# Patient Record
Sex: Male | Born: 1975 | Marital: Married | Smoking: Never smoker
Health system: Southern US, Community
[De-identification: ages and names within clinical notes are randomized; demographics above are authoritative.]

## PROBLEM LIST (undated history)

## (undated) HISTORY — PX: HERNIA REPAIR: SHX51

## (undated) HISTORY — PX: COLONOSCOPY: SHX174

---

## 2020-02-10 ENCOUNTER — Other Ambulatory Visit: Payer: Self-pay

## 2020-02-10 ENCOUNTER — Encounter: Payer: Self-pay | Admitting: Registered Nurse

## 2020-02-10 ENCOUNTER — Ambulatory Visit (INDEPENDENT_AMBULATORY_CARE_PROVIDER_SITE_OTHER): Payer: No Typology Code available for payment source | Admitting: Registered Nurse

## 2020-02-10 VITALS — BP 111/74 | HR 76 | Temp 97.9°F | Resp 17 | Ht 65.0 in | Wt 134.4 lb

## 2020-02-10 DIAGNOSIS — Z1329 Encounter for screening for other suspected endocrine disorder: Secondary | ICD-10-CM | POA: Diagnosis not present

## 2020-02-10 DIAGNOSIS — Z13220 Encounter for screening for lipoid disorders: Secondary | ICD-10-CM

## 2020-02-10 DIAGNOSIS — Z1159 Encounter for screening for other viral diseases: Secondary | ICD-10-CM

## 2020-02-10 DIAGNOSIS — Z0000 Encounter for general adult medical examination without abnormal findings: Secondary | ICD-10-CM | POA: Diagnosis not present

## 2020-02-10 DIAGNOSIS — Z130 Encounter for screening for diseases of the blood and blood-forming organs and certain disorders involving the immune mechanism: Secondary | ICD-10-CM

## 2020-02-10 DIAGNOSIS — Z23 Encounter for immunization: Secondary | ICD-10-CM

## 2020-02-10 DIAGNOSIS — Z7689 Persons encountering health services in other specified circumstances: Secondary | ICD-10-CM

## 2020-02-10 DIAGNOSIS — Z13228 Encounter for screening for other metabolic disorders: Secondary | ICD-10-CM

## 2020-02-10 NOTE — Progress Notes (Signed)
New

## 2020-02-10 NOTE — Patient Instructions (Signed)
° ° ° °  If you have lab work done today you will be contacted with your lab results within the next 2 weeks.  If you have not heard from us then please contact us. The fastest way to get your results is to register for My Chart. ° ° °IF you received an x-ray today, you will receive an invoice from [facility]. Please contact [facility] at [phone] with questions or concerns regarding your invoice.  ° °IF you received labwork today, you will receive an invoice from [facility]. Please contact [facility] at [phone] with questions or concerns regarding your invoice.  ° °Our billing staff will not be able to assist you with questions regarding bills from these companies. ° °You will be contacted with the lab results as soon as they are available. The fastest way to get your results is to activate your My Chart account. Instructions are located on the last page of this paperwork. If you have not heard from us regarding the results in 2 weeks, please contact this office. °  ° ° ° °

## 2020-02-11 LAB — COMPREHENSIVE METABOLIC PANEL
ALT: 21 IU/L (ref 0–44)
AST: 25 IU/L (ref 0–40)
Albumin/Globulin Ratio: 2 (ref 1.2–2.2)
Albumin: 4.5 g/dL (ref 4.0–5.0)
Alkaline Phosphatase: 72 IU/L (ref 39–117)
BUN/Creatinine Ratio: 12 (ref 9–20)
BUN: 8 mg/dL (ref 6–24)
Bilirubin Total: 0.5 mg/dL (ref 0.0–1.2)
CO2: 25 mmol/L (ref 20–29)
Calcium: 9.5 mg/dL (ref 8.7–10.2)
Chloride: 102 mmol/L (ref 96–106)
Creatinine, Ser: 0.68 mg/dL — ABNORMAL LOW (ref 0.76–1.27)
GFR calc Af Amer: 133 mL/min/{1.73_m2} (ref >59)
GFR calc non Af Amer: 115 mL/min/{1.73_m2} (ref >59)
Globulin, Total: 2.2 g/dL (ref 1.5–4.5)
Glucose: 100 mg/dL — ABNORMAL HIGH (ref 65–99)
Potassium: 4.3 mmol/L (ref 3.5–5.2)
Sodium: 140 mmol/L (ref 134–144)
Total Protein: 6.7 g/dL (ref 6.0–8.5)

## 2020-02-11 LAB — LIPID PANEL
Chol/HDL Ratio: 7.4 ratio — ABNORMAL HIGH (ref 0.0–5.0)
Cholesterol, Total: 216 mg/dL — ABNORMAL HIGH (ref 100–199)
HDL: 29 mg/dL — ABNORMAL LOW (ref >39)
LDL Chol Calc (NIH): 113 mg/dL — ABNORMAL HIGH (ref 0–99)
Triglycerides: 428 mg/dL — ABNORMAL HIGH (ref 0–149)
VLDL Cholesterol Cal: 74 mg/dL — ABNORMAL HIGH (ref 5–40)

## 2020-02-11 LAB — CBC WITH DIFFERENTIAL/PLATELET
Basophils Absolute: 0.1 10*3/uL (ref 0.0–0.2)
Basos: 1 %
EOS (ABSOLUTE): 0.1 10*3/uL (ref 0.0–0.4)
Eos: 2 %
Hematocrit: 43.3 % (ref 37.5–51.0)
Hemoglobin: 14.7 g/dL (ref 13.0–17.7)
Immature Grans (Abs): 0 10*3/uL (ref 0.0–0.1)
Immature Granulocytes: 1 %
Lymphocytes Absolute: 1.7 10*3/uL (ref 0.7–3.1)
Lymphs: 27 %
MCH: 29.6 pg (ref 26.6–33.0)
MCHC: 33.9 g/dL (ref 31.5–35.7)
MCV: 87 fL (ref 79–97)
Monocytes Absolute: 0.6 10*3/uL (ref 0.1–0.9)
Monocytes: 9 %
Neutrophils Absolute: 3.8 10*3/uL (ref 1.4–7.0)
Neutrophils: 60 %
Platelets: 309 10*3/uL (ref 150–450)
RBC: 4.96 x10E6/uL (ref 4.14–5.80)
RDW: 12.5 % (ref 11.6–15.4)
WBC: 6.3 10*3/uL (ref 3.4–10.8)

## 2020-02-11 LAB — TSH: TSH: 3.71 u[IU]/mL (ref 0.450–4.500)

## 2020-02-11 LAB — HIV ANTIBODY (ROUTINE TESTING W REFLEX): HIV Screen 4th Generation wRfx: NONREACTIVE

## 2020-02-11 LAB — HEMOGLOBIN A1C
Est. average glucose Bld gHb Est-mCnc: 100 mg/dL
Hgb A1c MFr Bld: 5.1 % (ref 4.8–5.6)

## 2020-02-22 ENCOUNTER — Encounter: Payer: Self-pay | Admitting: Registered Nurse

## 2020-02-22 NOTE — Progress Notes (Signed)
New Patient Office Visit  Subjective:  Patient ID: Darryl Jackson, male    DOB: 17-Sep-1976  Age: 44 y.o. MRN: [id number]  CC:  Chief Complaint  Patient presents with  . New Patient (Initial Visit)    establish care and CPE    HPI Darryl Ramkumar Ranganathan presents for visit to establish care and CPE Due for tdap - will give today  No major concerns or complaints  Recently moved to [place] from [place], [place] with his wife and young child. She will be establishing care with our office within the next week or two.  No major medical hx. Has had colonoscopy and hernia repair.   History reviewed. No pertinent past medical history.  Past Surgical History:  Procedure Laterality Date  . COLONOSCOPY    . HERNIA REPAIR      Family History  Problem Relation Age of Onset  . Diabetes Mother   . Healthy Father   . Healthy Sister     Social History   Socioeconomic History  . Marital status: Married    Spouse name: Not on file  . Number of children: Not on file  . Years of education: Not on file  . Highest education level: Not on file  Occupational History  . Not on file  Tobacco Use  . Smoking status: Never Smoker  . Smokeless tobacco: Never Used  Substance and Sexual Activity  . Alcohol use: Never  . Drug use: Never  . Sexual activity: Yes  Other Topics Concern  . Not on file  Social History Narrative  . Not on file   Social Determinants of Health   Financial Resource Strain:   . Difficulty of Paying Living Expenses:   Food Insecurity:   . Worried About Programme researcher, broadcasting/film/video in the Last Year:   . Barista in the Last Year:   Transportation Needs:   . Freight forwarder (Medical):   Marland Kitchen Lack of Transportation (Non-Medical):   Physical Activity:   . Days of Exercise per Week:   . Minutes of Exercise per Session:   Stress:   . Feeling of Stress :   Social Connections:   . Frequency of Communication with Friends and Family:   . Frequency of Social  Gatherings with Friends and Family:   . Attends Religious Services:   . Active Member of Clubs or Organizations:   . Attends Banker Meetings:   Marland Kitchen Marital Status:   Intimate Partner Violence:   . Fear of Current or Ex-Partner:   . Emotionally Abused:   Marland Kitchen Physically Abused:   . Sexually Abused:     ROS Review of Systems  Constitutional: Negative.   HENT: Negative.   Eyes: Negative.   Respiratory: Negative.   Cardiovascular: Negative.   Gastrointestinal: Negative.   Endocrine: Negative.   Genitourinary: Negative.   Musculoskeletal: Negative.   Skin: Negative.   Allergic/Immunologic: Negative.   Neurological: Negative.   Hematological: Negative.   Psychiatric/Behavioral: Negative.   All other systems reviewed and are negative.   Objective:   Today's Vitals: BP 111/74   Pulse 76   Temp 97.9 F (36.6 C) (Temporal)   Resp 17   Ht 5\' 5"  (1.651 m)   Wt 134 lb 6.4 oz (61 kg)   SpO2 99%   BMI 22.37 kg/m   Physical Exam Vitals and nursing note reviewed.  Constitutional:      General: He is not in acute distress.  Appearance: Normal appearance. He is normal weight. He is not ill-appearing, toxic-appearing or diaphoretic.  HENT:     Head: Normocephalic and atraumatic.     Right Ear: Tympanic membrane, ear canal and external ear normal. There is no impacted cerumen.     Left Ear: Tympanic membrane, ear canal and external ear normal. There is no impacted cerumen.     Nose: Nose normal. No congestion or rhinorrhea.     Mouth/Throat:     Mouth: Mucous membranes are moist.     Pharynx: Oropharynx is clear. No oropharyngeal exudate or posterior oropharyngeal erythema.  Eyes:     General: No scleral icterus.       Right eye: No discharge.        Left eye: No discharge.     Extraocular Movements: Extraocular movements intact.     Conjunctiva/sclera: Conjunctivae normal.     Pupils: Pupils are equal, round, and reactive to light.  Cardiovascular:     Rate and  Rhythm: Normal rate and regular rhythm.     Pulses: Normal pulses.     Heart sounds: Normal heart sounds. No murmur. No friction rub. No gallop.   Pulmonary:     Effort: Pulmonary effort is normal. No respiratory distress.     Breath sounds: Normal breath sounds. No stridor. No wheezing, rhonchi or rales.  Chest:     Chest wall: No tenderness.  Abdominal:     General: Abdomen is flat. Bowel sounds are normal. There is no distension.     Palpations: Abdomen is soft. There is no mass.     Tenderness: There is no abdominal tenderness. There is no right CVA tenderness, left CVA tenderness, guarding or rebound.     Hernia: No hernia is present.  Musculoskeletal:        General: No swelling, tenderness, deformity or signs of injury. Normal range of motion.     Right lower leg: No edema.     Left lower leg: No edema.  Skin:    General: Skin is warm and dry.     Capillary Refill: Capillary refill takes less than 2 seconds.     Coloration: Skin is not jaundiced or pale.     Findings: No bruising, erythema, lesion or rash.  Neurological:     General: No focal deficit present.     Mental Status: He is alert and oriented to person, place, and time. Mental status is at baseline.     Cranial Nerves: No cranial nerve deficit.     Sensory: No sensory deficit.     Motor: No weakness.     Coordination: Coordination normal.     Gait: Gait normal.     Deep Tendon Reflexes: Reflexes normal.  Psychiatric:        Mood and Affect: Mood normal.        Behavior: Behavior normal.        Thought Content: Thought content normal.        Judgment: Judgment normal.     Assessment & Plan:   Problem List Items Addressed This Visit    None    Visit Diagnoses    Screening for endocrine, metabolic and immunity disorder    -  Primary   Relevant Orders   CBC with Differential (Completed)   Hemoglobin A1c (Completed)   Comprehensive metabolic panel (Completed)   TSH (Completed)   Need for  diphtheria-tetanus-pertussis (Tdap) vaccine       Relevant Orders   Tdap vaccine greater  than or equal to 7yo IM (Completed)   Lipid screening       Relevant Orders   Lipid panel (Completed)   Screening for viral disease       Relevant Orders   HIV antibody (with reflex) (Completed)   Encounter to establish care       Annual physical exam          No outpatient encounter medications on file as of 02/10/2020.   No facility-administered encounter medications on file as of 02/10/2020.    Follow-up: No follow-ups on file.   PLAN  Labs collected, will follow up as warranted  Exam unremarkable  Otherwise no concerns  Patient encouraged to call clinic with any questions, comments, or concerns.  Janeece Agee, NP

## 2020-02-24 ENCOUNTER — Encounter: Payer: Self-pay | Admitting: Registered Nurse

## 2020-02-24 DIAGNOSIS — E782 Mixed hyperlipidemia: Secondary | ICD-10-CM

## 2020-02-24 MED ORDER — ATORVASTATIN CALCIUM 40 MG PO TABS: 40.0000 mg | ORAL_TABLET | Freq: Every day | ORAL | 3 refills | Status: DC

## 2020-02-24 NOTE — Progress Notes (Signed)
Elevated lipids Called patient Start atorvastatin 40mg  PO qd with dinner Return in 6 mos  , NP

## 2020-02-28 ENCOUNTER — Encounter: Payer: Self-pay | Admitting: Registered Nurse

## 2020-04-08 ENCOUNTER — Telehealth: Payer: Self-pay | Admitting: Registered Nurse

## 2020-04-08 NOTE — Telephone Encounter (Signed)
Pt called and stated he missed a call from our office and was returning that call. Please advise.

## 2020-04-08 NOTE — Telephone Encounter (Signed)
Looked through pt chart saw no notes, lab results or upcoming appts. Unsure what the call was about

## 2020-08-05 ENCOUNTER — Encounter: Payer: Self-pay | Admitting: Registered Nurse

## 2020-08-05 ENCOUNTER — Ambulatory Visit (INDEPENDENT_AMBULATORY_CARE_PROVIDER_SITE_OTHER): Payer: No Typology Code available for payment source | Admitting: Registered Nurse

## 2020-08-05 ENCOUNTER — Other Ambulatory Visit: Payer: Self-pay

## 2020-08-05 VITALS — BP 106/72 | HR 69 | Temp 98.4°F | Ht 64.0 in | Wt 133.4 lb

## 2020-08-05 DIAGNOSIS — Z1159 Encounter for screening for other viral diseases: Secondary | ICD-10-CM

## 2020-08-05 DIAGNOSIS — Z1329 Encounter for screening for other suspected endocrine disorder: Secondary | ICD-10-CM | POA: Diagnosis not present

## 2020-08-05 DIAGNOSIS — Z13228 Encounter for screening for other metabolic disorders: Secondary | ICD-10-CM

## 2020-08-05 DIAGNOSIS — E785 Hyperlipidemia, unspecified: Secondary | ICD-10-CM

## 2020-08-05 DIAGNOSIS — Z13220 Encounter for screening for lipoid disorders: Secondary | ICD-10-CM

## 2020-08-05 DIAGNOSIS — Z130 Encounter for screening for diseases of the blood and blood-forming organs and certain disorders involving the immune mechanism: Secondary | ICD-10-CM

## 2020-08-05 DIAGNOSIS — Z23 Encounter for immunization: Secondary | ICD-10-CM

## 2020-08-05 NOTE — Patient Instructions (Signed)
° ° ° °  If you have lab work done today you will be contacted with your lab results within the next 2 weeks.  If you have not heard from us then please contact us. The fastest way to get your results is to register for My Chart. ° ° °IF you received an x-ray today, you will receive an invoice from [facility]. Please contact [facility] at [phone] with questions or concerns regarding your invoice.  ° °IF you received labwork today, you will receive an invoice from [facility]. Please contact [facility] at [phone] with questions or concerns regarding your invoice.  ° °Our billing staff will not be able to assist you with questions regarding bills from these companies. ° °You will be contacted with the lab results as soon as they are available. The fastest way to get your results is to activate your My Chart account. Instructions are located on the last page of this paperwork. If you have not heard from us regarding the results in 2 weeks, please contact this office. °  ° ° ° °

## 2020-08-06 LAB — HEMOGLOBIN A1C
Est. average glucose Bld gHb Est-mCnc: 103 mg/dL
Hgb A1c MFr Bld: 5.2 % (ref 4.8–5.6)

## 2020-08-06 LAB — CMP14+EGFR
ALT: 14 IU/L (ref 0–44)
AST: 19 IU/L (ref 0–40)
Albumin/Globulin Ratio: 1.8 (ref 1.2–2.2)
Albumin: 4.4 g/dL (ref 4.0–5.0)
Alkaline Phosphatase: 67 IU/L (ref 44–121)
BUN/Creatinine Ratio: 14 (ref 9–20)
BUN: 9 mg/dL (ref 6–24)
Bilirubin Total: 0.6 mg/dL (ref 0.0–1.2)
CO2: 26 mmol/L (ref 20–29)
Calcium: 9.7 mg/dL (ref 8.7–10.2)
Chloride: 103 mmol/L (ref 96–106)
Creatinine, Ser: 0.64 mg/dL — ABNORMAL LOW (ref 0.76–1.27)
GFR calc Af Amer: 137 mL/min/{1.73_m2} (ref >59)
GFR calc non Af Amer: 118 mL/min/{1.73_m2} (ref >59)
Globulin, Total: 2.4 g/dL (ref 1.5–4.5)
Glucose: 85 mg/dL (ref 65–99)
Potassium: 4.2 mmol/L (ref 3.5–5.2)
Sodium: 139 mmol/L (ref 134–144)
Total Protein: 6.8 g/dL (ref 6.0–8.5)

## 2020-08-06 LAB — LIPID PANEL
Chol/HDL Ratio: 7.7 ratio — ABNORMAL HIGH (ref 0.0–5.0)
Cholesterol, Total: 215 mg/dL — ABNORMAL HIGH (ref 100–199)
HDL: 28 mg/dL — ABNORMAL LOW (ref >39)
LDL Chol Calc (NIH): 129 mg/dL — ABNORMAL HIGH (ref 0–99)
Triglycerides: 323 mg/dL — ABNORMAL HIGH (ref 0–149)
VLDL Cholesterol Cal: 58 mg/dL — ABNORMAL HIGH (ref 5–40)

## 2020-08-06 LAB — HEPATITIS C ANTIBODY: Hep C Virus Ab: <0.1 s/co ratio (ref 0.0–0.9)

## 2020-08-10 ENCOUNTER — Encounter: Payer: Self-pay | Admitting: Registered Nurse

## 2020-08-16 ENCOUNTER — Other Ambulatory Visit: Payer: Self-pay | Admitting: Registered Nurse

## 2020-08-16 DIAGNOSIS — E782 Mixed hyperlipidemia: Secondary | ICD-10-CM

## 2020-08-16 MED ORDER — ATORVASTATIN CALCIUM 40 MG PO TABS: 40.0000 mg | ORAL_TABLET | Freq: Every day | ORAL | 3 refills | Status: DC

## 2020-08-17 ENCOUNTER — Other Ambulatory Visit: Payer: Self-pay | Admitting: Registered Nurse

## 2020-08-17 DIAGNOSIS — E782 Mixed hyperlipidemia: Secondary | ICD-10-CM

## 2020-08-17 MED ORDER — COLESEVELAM HCL 625 MG PO TABS: 625.0000 mg | ORAL_TABLET | Freq: Two times a day (BID) | ORAL | 3 refills | Status: DC

## 2020-10-09 ENCOUNTER — Encounter: Payer: Self-pay | Admitting: Registered Nurse

## 2020-10-09 NOTE — Progress Notes (Signed)
Established Patient Office Visit  Subjective:  Patient ID: Darryl Jackson, male    DOB: [birth year]  Age: 44 y.o. MRN: [id number]  CC:  Chief Complaint  Patient presents with   Follow-up    x1mo    HPI Darryl Ramkumar Salberg presents for 6 mo follow up  Hyperlipidemia: has been taking medication regularly no AEs. Feeling well overall No other concerns.  Flu vaccine due today - pt requests this  History reviewed. No pertinent past medical history.  Past Surgical History:  Procedure Laterality Date   COLONOSCOPY     HERNIA REPAIR      Family History  Problem Relation Age of Onset   Diabetes Mother    Healthy Father    Healthy Sister     Social History   Socioeconomic History   Marital status: Married    Spouse name: Not on file   Number of children: Not on file   Years of education: Not on file   Highest education level: Not on file  Occupational History   Not on file  Tobacco Use   Smoking status: Never Smoker   Smokeless tobacco: Never Used  Vaping Use   Vaping Use: Never used  Substance and Sexual Activity   Alcohol use: Never   Drug use: Never   Sexual activity: Yes  Other Topics Concern   Not on file  Social History Narrative   Not on file   Social Determinants of Health   Financial Resource Strain:    Difficulty of Paying Living Expenses: Not on file  Food Insecurity:    Worried About REast [place] the Last Year: Not on file   Ran Out of Food in the Last Year: Not on file  Transportation Needs:    Lack of Transportation (Medical): Not on file   Lack of Transportation (Non-Medical): Not on file  Physical Activity:    Days of Exercise per Week: Not on file   Minutes of Exercise per Session: Not on file  Stress:    Feeling of Stress : Not on file  Social Connections:    Frequency of Communication with Friends and Family: Not on file   Frequency of Social Gatherings with Friends and Family:  Not on file   Attends Religious Services: Not on file   Active Member of Clubs or Organizations: Not on file   Attends CArchivistMeetings: Not on file   Marital Status: Not on file  Intimate Partner Violence:    Fear of Current or Ex-Partner: Not on file   Emotionally Abused: Not on file   Physically Abused: Not on file   Sexually Abused: Not on file    Outpatient Medications Prior to Visit  Medication Sig Dispense Refill   atorvastatin (LIPITOR) 40 MG tablet Take 1 tablet (40 mg total) by mouth daily. (Patient not taking: Reported on 08/05/2020) 90 tablet 3   No facility-administered medications prior to visit.    No Known Allergies  ROS Review of Systems  Constitutional: Negative.   HENT: Negative.   Eyes: Negative.   Respiratory: Negative.   Cardiovascular: Negative.   Gastrointestinal: Negative.   Genitourinary: Negative.   Musculoskeletal: Negative.   Skin: Negative.   Neurological: Negative.   Psychiatric/Behavioral: Negative.       Objective:    Physical Exam Constitutional:      General: He is not in acute distress.    Appearance: Normal appearance. He is normal weight. He is not ill-appearing,  toxic-appearing or diaphoretic.  Cardiovascular:     Rate and Rhythm: Normal rate and regular rhythm.     Heart sounds: Normal heart sounds. No murmur heard.  No friction rub. No gallop.   Pulmonary:     Effort: Pulmonary effort is normal. No respiratory distress.     Breath sounds: Normal breath sounds. No stridor. No wheezing, rhonchi or rales.  Chest:     Chest wall: No tenderness.  Neurological:     General: No focal deficit present.     Mental Status: He is alert and oriented to person, place, and time. Mental status is at baseline.  Psychiatric:        Mood and Affect: Mood normal.        Behavior: Behavior normal.        Thought Content: Thought content normal.        Judgment: Judgment normal.     BP 106/72 (BP Location: Right  Arm, Patient Position: Sitting, Cuff Size: Normal)    Pulse 69    Temp 98.4 F (36.9 C) (Temporal)    Ht 5' 4"  (1.626 m)    Wt 133 lb 6.4 oz (60.5 kg)    SpO2 97%    BMI 22.90 kg/m  Wt Readings from Last 3 Encounters:  08/05/20 133 lb 6.4 oz (60.5 kg)  02/10/20 134 lb 6.4 oz (61 kg)     There are no preventive care reminders to display for this patient.  There are no preventive care reminders to display for this patient.  Lab Results  Component Value Date   TSH 3.710 02/10/2020   Lab Results  Component Value Date   WBC 6.3 02/10/2020   HGB 14.7 02/10/2020   HCT 43.3 02/10/2020   MCV 87 02/10/2020   PLT 309 02/10/2020   Lab Results  Component Value Date   NA 139 08/05/2020   K 4.2 08/05/2020   CO2 26 08/05/2020   GLUCOSE 85 08/05/2020   BUN 9 08/05/2020   CREATININE 0.64 (L) 08/05/2020   BILITOT 0.6 08/05/2020   ALKPHOS 67 08/05/2020   AST 19 08/05/2020   ALT 14 08/05/2020   PROT 6.8 08/05/2020   ALBUMIN 4.4 08/05/2020   CALCIUM 9.7 08/05/2020   Lab Results  Component Value Date   CHOL 215 (H) 08/05/2020   Lab Results  Component Value Date   HDL 28 (L) 08/05/2020   Lab Results  Component Value Date   LDLCALC 129 (H) 08/05/2020   Lab Results  Component Value Date   TRIG 323 (H) 08/05/2020   Lab Results  Component Value Date   CHOLHDL 7.7 (H) 08/05/2020   Lab Results  Component Value Date   HGBA1C 5.2 08/05/2020      Assessment & Plan:   Problem List Items Addressed This Visit    None    Visit Diagnoses    Need for prophylactic vaccination and inoculation against influenza    -  Primary   Relevant Orders   Flu Vaccine QUAD 36+ mos IM (Completed)   Screening for endocrine, metabolic and immunity disorder       Relevant Orders   CMP14+EGFR (Completed)   Hemoglobin A1c (Completed)   Encounter for hepatitis C screening test for low risk patient       Relevant Orders   Hepatitis C antibody (Completed)   Lipid screening       Relevant Orders    Lipid panel (Completed)      No orders of the  defined types were placed in this encounter.   Follow-up: No follow-ups on file.   PLAN Labs collected. Will follow up with the patient as warranted. Refill meds Flu vaccine given Return in 6 mo Patient encouraged to call clinic with any questions, comments, or concerns.  Maximiano Coss, NP

## 2020-11-07 ENCOUNTER — Other Ambulatory Visit: Payer: Self-pay | Admitting: Registered Nurse

## 2020-11-07 ENCOUNTER — Other Ambulatory Visit: Payer: Self-pay

## 2020-11-07 DIAGNOSIS — E782 Mixed hyperlipidemia: Secondary | ICD-10-CM

## 2020-11-08 ENCOUNTER — Other Ambulatory Visit: Payer: Self-pay | Admitting: Registered Nurse

## 2020-11-08 DIAGNOSIS — E782 Mixed hyperlipidemia: Secondary | ICD-10-CM

## 2020-11-08 LAB — LIPID PANEL
Chol/HDL Ratio: 9 ratio — ABNORMAL HIGH (ref 0.0–5.0)
Cholesterol, Total: 208 mg/dL — ABNORMAL HIGH (ref 100–199)
HDL: 23 mg/dL — ABNORMAL LOW (ref >39)
LDL Chol Calc (NIH): 87 mg/dL (ref 0–99)
Triglycerides: 601 mg/dL (ref 0–149)
VLDL Cholesterol Cal: 98 mg/dL — ABNORMAL HIGH (ref 5–40)

## 2020-11-09 ENCOUNTER — Ambulatory Visit: Payer: No Typology Code available for payment source

## 2020-11-09 ENCOUNTER — Other Ambulatory Visit: Payer: Self-pay

## 2020-11-09 DIAGNOSIS — E782 Mixed hyperlipidemia: Secondary | ICD-10-CM

## 2020-11-09 LAB — LIPID PANEL
Chol/HDL Ratio: 7.3 ratio — ABNORMAL HIGH (ref 0.0–5.0)
Cholesterol, Total: 218 mg/dL — ABNORMAL HIGH (ref 100–199)
HDL: 30 mg/dL — ABNORMAL LOW (ref >39)
LDL Chol Calc (NIH): 124 mg/dL — ABNORMAL HIGH (ref 0–99)
Triglycerides: 358 mg/dL — ABNORMAL HIGH (ref 0–149)
VLDL Cholesterol Cal: 64 mg/dL — ABNORMAL HIGH (ref 5–40)

## 2020-11-10 ENCOUNTER — Encounter: Payer: Self-pay | Admitting: Registered Nurse

## 2020-11-16 ENCOUNTER — Ambulatory Visit (INDEPENDENT_AMBULATORY_CARE_PROVIDER_SITE_OTHER): Payer: No Typology Code available for payment source | Admitting: Registered Nurse

## 2020-11-16 ENCOUNTER — Encounter: Payer: Self-pay | Admitting: Registered Nurse

## 2020-11-16 ENCOUNTER — Other Ambulatory Visit: Payer: Self-pay

## 2020-11-16 VITALS — BP 117/73 | HR 89 | Temp 98.2°F | Ht 61.0 in | Wt 134.0 lb

## 2020-11-16 DIAGNOSIS — E782 Mixed hyperlipidemia: Secondary | ICD-10-CM

## 2020-11-16 MED ORDER — OMEGA-3-ACID ETHYL ESTERS 1 G PO CAPS: 1.0000 g | ORAL_CAPSULE | Freq: Two times a day (BID) | ORAL | 1 refills | Status: DC

## 2020-11-16 MED ORDER — PSYLLIUM 0.52 G PO CAPS: 0.5200 g | ORAL_CAPSULE | Freq: Every day | ORAL | 1 refills | Status: DC

## 2020-11-16 NOTE — Progress Notes (Signed)
Established Patient Office Visit  Subjective:  Patient ID: Darryl Jackson, male    DOB: 1976-09-18  Age: 45 y.o. MRN: [id number]  CC:  Chief Complaint  Patient presents with  . lab work follow up     Discuss labs     HPI Darryl Jackson presents for lab review  Lipids overall steady - taking colesevelam regularly, tolerates well Did not tolerate atorvastatin unfortunately, had myalgias. We are not seeing improvement as desired in lipid panel. Discussed lifestyle changes Pt is interested in doing more to lower his risk for heart attack and stroke Diet is overall very good - many fruits and veggies, meat sparingly and mostly chicken and fish, some mutton, no pork or beef. No CV symptoms at this time.  No past medical history on file.  Past Surgical History:  Procedure Laterality Date  . COLONOSCOPY    . HERNIA REPAIR      Family History  Problem Relation Age of Onset  . Diabetes Mother   . Healthy Father   . Healthy Sister     Social History   Socioeconomic History  . Marital status: Married    Spouse name: Not on file  . Number of children: Not on file  . Years of education: Not on file  . Highest education level: Not on file  Occupational History  . Not on file  Tobacco Use  . Smoking status: Never Smoker  . Smokeless tobacco: Never Used  Vaping Use  . Vaping Use: Never used  Substance and Sexual Activity  . Alcohol use: Never  . Drug use: Never  . Sexual activity: Yes  Other Topics Concern  . Not on file  Social History Narrative  . Not on file   Social Determinants of Health   Financial Resource Strain: Not on file  Food Insecurity: Not on file  Transportation Needs: Not on file  Physical Activity: Not on file  Stress: Not on file  Social Connections: Not on file  Intimate Partner Violence: Not on file    Outpatient Medications Prior to Visit  Medication Sig Dispense Refill  . colesevelam (WELCHOL) 625 MG tablet Take 1  tablet (625 mg total) by mouth 2 (two) times daily with a meal. 180 tablet 3  . atorvastatin (LIPITOR) 40 MG tablet Take 1 tablet (40 mg total) by mouth daily. (Patient not taking: Reported on 11/16/2020) 90 tablet 3   No facility-administered medications prior to visit.    No Known Allergies  ROS Review of Systems  Constitutional: Negative.   HENT: Negative.   Eyes: Negative.   Respiratory: Negative.   Cardiovascular: Negative.   Gastrointestinal: Negative.   Genitourinary: Negative.   Musculoskeletal: Negative.   Skin: Negative.   Neurological: Negative.   Psychiatric/Behavioral: Negative.   All other systems reviewed and are negative.     Objective:    Physical Exam Constitutional:      General: He is not in acute distress.    Appearance: Normal appearance. He is normal weight. He is not ill-appearing, toxic-appearing or diaphoretic.  Cardiovascular:     Rate and Rhythm: Normal rate and regular rhythm.     Heart sounds: Normal heart sounds. No murmur heard. No friction rub. No gallop.   Pulmonary:     Effort: Pulmonary effort is normal. No respiratory distress.     Breath sounds: Normal breath sounds. No stridor. No wheezing, rhonchi or rales.  Chest:     Chest wall: No tenderness.  Neurological:  General: No focal deficit present.     Mental Status: He is alert and oriented to person, place, and time. Mental status is at baseline.  Psychiatric:        Mood and Affect: Mood normal.        Behavior: Behavior normal.        Thought Content: Thought content normal.        Judgment: Judgment normal.     BP 117/73   Pulse 89   Temp 98.2 F (36.8 C) (Temporal)   Ht 5\' 1"  (1.549 m)   Wt 134 lb (60.8 kg)   SpO2 100%   BMI 25.32 kg/m  Wt Readings from Last 3 Encounters:  11/16/20 134 lb (60.8 kg)  08/05/20 133 lb 6.4 oz (60.5 kg)  02/10/20 134 lb 6.4 oz (61 kg)     Health Maintenance Due  Topic Date Due  . COLONOSCOPY (Pts 45-7yrs Insurance coverage  will need to be confirmed)  Never done    There are no preventive care reminders to display for this patient.  Lab Results  Component Value Date   TSH 3.710 02/10/2020   Lab Results  Component Value Date   WBC 6.3 02/10/2020   HGB 14.7 02/10/2020   HCT 43.3 02/10/2020   MCV 87 02/10/2020   PLT 309 02/10/2020   Lab Results  Component Value Date   NA 139 08/05/2020   K 4.2 08/05/2020   CO2 26 08/05/2020   GLUCOSE 85 08/05/2020   BUN 9 08/05/2020   CREATININE 0.64 (L) 08/05/2020   BILITOT 0.6 08/05/2020   ALKPHOS 67 08/05/2020   AST 19 08/05/2020   ALT 14 08/05/2020   PROT 6.8 08/05/2020   ALBUMIN 4.4 08/05/2020   CALCIUM 9.7 08/05/2020   Lab Results  Component Value Date   CHOL 218 (H) 11/09/2020   Lab Results  Component Value Date   HDL 30 (L) 11/09/2020   Lab Results  Component Value Date   LDLCALC 124 (H) 11/09/2020   Lab Results  Component Value Date   TRIG 358 (H) 11/09/2020   Lab Results  Component Value Date   CHOLHDL 7.3 (H) 11/09/2020   Lab Results  Component Value Date   HGBA1C 5.2 08/05/2020      Assessment & Plan:   Problem List Items Addressed This Visit   None   Visit Diagnoses    Mixed hyperlipidemia    -  Primary   Relevant Medications   omega-3 acid ethyl esters (LOVAZA) 1 g capsule   psyllium (REGULOID) 0.52 g capsule      Meds ordered this encounter  Medications  . omega-3 acid ethyl esters (LOVAZA) 1 g capsule    Sig: Take 1 capsule (1 g total) by mouth 2 (two) times daily.    Dispense:  180 capsule    Refill:  1    Order Specific Question:   Supervising Provider    Answer:   08/07/2020, JEFFREY R [[id number]]  . psyllium (REGULOID) 0.52 g capsule    Sig: Take 1 capsule (0.52 g total) by mouth daily.    Dispense:  180 capsule    Refill:  1    Order Specific Question:   Supervising Provider    Answer:   Neva Seat, JEFFREY R [[id number]]    Follow-up: No follow-ups on file.   PLAN  Will start lovaza 1g po bid  Start psyllium  husk supplement once daily  Given information about diet and exercise  Suspect strong genetic  influence on lipid panel  Will recheck in 3 mo  Patient encouraged to call clinic with any questions, comments, or concerns.  Janeece Agee, NP

## 2020-11-16 NOTE — Patient Instructions (Addendum)
I recommend:  Psyllium husk or other fiber supplement daily And  Omega 3 fatty acid or Fish oil capsules  To help lower cholesterol. These are reliably found over the counter, but I will try to send them as prescriptions to see if your insurance will pay for them.   See below for information about cholesterol.   If you have lab work done today you will be contacted with your lab results within the next 2 weeks.  If you have not heard from us then please contact us. The fastest way to get your results is to register for My Chart.   IF you received an x-ray today, you will receive an invoice from [facility]. Please contact [facility] at [phone] with questions or concerns regarding your invoice.   IF you received labwork today, you will receive an invoice from [facility]. Please contact [facility] at [phone] with questions or concerns regarding your invoice.   Our billing staff will not be able to assist you with questions regarding bills from these companies.  You will be contacted with the lab results as soon as they are available. The fastest way to get your results is to activate your My Chart account. Instructions are located on the last page of this paperwork. If you have not heard from us regarding the results in 2 weeks, please contact this office.      Cholesterol Content in Foods Cholesterol is a waxy, fat-like substance that helps to carry fat in the blood. The body needs cholesterol in small amounts, but too much cholesterol can cause damage to the arteries and heart. Most people should eat less than 200 milligrams (mg) of cholesterol a day. Foods with cholesterol  Cholesterol is found in animal-based foods, such as meat, seafood, and dairy. Generally, low-fat dairy and lean meats have less cholesterol than full-fat dairy and fatty meats. The milligrams of cholesterol per serving (mg per serving) of common cholesterol-containing foods are listed  below. Meat and other proteins  Egg -- one large whole egg has 186 mg.  Veal shank -- 4 oz has 141 mg.  Lean ground [place] (93% lean) -- 4 oz has 118 mg.  Fat-trimmed lamb loin -- 4 oz has 106 mg.  Lean ground beef (90% lean) -- 4 oz has 100 mg.  Lobster -- 3.5 oz has 90 mg.  Pork loin chops -- 4 oz has 86 mg.  Canned salmon -- 3.5 oz has 83 mg.  Fat-trimmed beef top loin -- 4 oz has 78 mg.  Frankfurter -- 1 frank (3.5 oz) has 77 mg.  Crab -- 3.5 oz has 71 mg.  Roasted chicken without skin, white meat -- 4 oz has 66 mg.  Light bologna -- 2 oz has 45 mg.  Deli-cut [place] -- 2 oz has 31 mg.  Canned tuna -- 3.5 oz has 31 mg.  Tomasa BlaseBacon -- 1 oz has 29 mg.  Oysters and mussels (raw) -- 3.5 oz has 25 mg.  Mackerel -- 1 oz has 22 mg.  Trout -- 1 oz has 20 mg.  Pork sausage -- 1 link (1 oz) has 17 mg.  Salmon -- 1 oz has 16 mg.  Tilapia -- 1 oz has 14 mg. Dairy  Soft-serve ice cream --  cup (4 oz) has 103 mg.  Whole-milk yogurt -- 1 cup (8 oz) has 29 mg.  Cheddar cheese -- 1 oz has 28 mg.  American cheese -- 1 oz has 28 mg.  Whole milk -- 1  cup (8 oz) has 23 mg.  2% milk -- 1 cup (8 oz) has 18 mg.  Cream cheese -- 1 tablespoon (Tbsp) has 15 mg.  Cottage cheese --  cup (4 oz) has 14 mg.  Low-fat (1%) milk -- 1 cup (8 oz) has 10 mg.  Sour cream -- 1 Tbsp has 8.5 mg.  Low-fat yogurt -- 1 cup (8 oz) has 8 mg.  Nonfat Greek yogurt -- 1 cup (8 oz) has 7 mg.  Half-and-half cream -- 1 Tbsp has 5 mg. Fats and oils  Cod liver oil -- 1 tablespoon (Tbsp) has 82 mg.  Butter -- 1 Tbsp has 15 mg.  Lard -- 1 Tbsp has 14 mg.  Bacon grease -- 1 Tbsp has 14 mg.  Mayonnaise -- 1 Tbsp has 5-10 mg.  Margarine -- 1 Tbsp has 3-10 mg. Exact amounts of cholesterol in these foods may vary depending on specific ingredients and brands. Foods without cholesterol Most plant-based foods do not have cholesterol unless you combine them with a food that has cholesterol.  Foods without cholesterol include:  Grains and cereals.  Vegetables.  Fruits.  Vegetable oils, such as olive, canola, and sunflower oil.  Legumes, such as peas, beans, and lentils.  Nuts and seeds.  Egg whites. Summary  The body needs cholesterol in small amounts, but too much cholesterol can cause damage to the arteries and heart.  Most people should eat less than 200 milligrams (mg) of cholesterol a day. This information is not intended to replace advice given to you by your health care provider. Make sure you discuss any questions you have with your health care provider. Document Revised: 10/11/2017 Document Reviewed: 06/25/2017 Elsevier Patient Education  [place].  Dyslipidemia Dyslipidemia is an imbalance of waxy, fat-like substances (lipids) in the blood. The body needs lipids in small amounts. Dyslipidemia often involves a high level of cholesterol or triglycerides, which are types of lipids. Common forms of dyslipidemia include:  High levels of LDL cholesterol. LDL is the type of cholesterol that causes fatty deposits (plaques) to build up in the blood vessels that carry blood away from your heart (arteries).  Low levels of HDL cholesterol. HDL cholesterol is the type of cholesterol that protects against heart disease. High levels of HDL remove the LDL buildup from arteries.  High levels of triglycerides. Triglycerides are a fatty substance in the blood that is linked to a buildup of plaques in the arteries. What are the causes? Primary dyslipidemia is caused by changes (mutations) in genes that are passed down through families (inherited). These mutations cause several types of dyslipidemia. Secondary dyslipidemia is caused by lifestyle choices and diseases that lead to dyslipidemia, such as:  Eating a diet that is high in animal fat.  Not getting enough exercise.  Having diabetes, kidney disease, liver disease, or thyroid disease.  Drinking large  amounts of alcohol.  Using certain medicines. What increases the risk? You are more likely to develop this condition if you are an older man or if you are a woman who has gone through menopause. Other risk factors include:  Having a family history of dyslipidemia.  Taking certain medicines, including birth control pills, steroids, some diuretics, and beta-blockers.  Smoking cigarettes.  Eating a high-fat diet.  Having certain medical conditions such as diabetes, polycystic ovary syndrome (PCOS), kidney disease, liver disease, or hypothyroidism.  Not exercising regularly.  Being overweight or obese with too much belly fat. What are the signs or symptoms? In most  cases, dyslipidemia does not usually cause any symptoms. In severe cases, very high lipid levels can cause:  Fatty bumps under the skin (xanthomas).  White or gray ring around the black center (pupil) of the eye. Very high triglyceride levels can cause inflammation of the pancreas (pancreatitis). How is this diagnosed? Your health care provider may diagnose dyslipidemia based on a routine blood test (fasting blood test). Because most people do not have symptoms of the condition, this blood testing (lipid profile) is done on adults age 22 and older and is repeated every 5 years. This test checks:  Total cholesterol. This measures the total amount of cholesterol in your blood, including LDL cholesterol, HDL cholesterol, and triglycerides. A healthy number is below 200.  LDL cholesterol. The target number for LDL cholesterol is different for each person, depending on individual risk factors. Ask your health care provider what your LDL cholesterol should be.  HDL cholesterol. An HDL level of 60 or higher is best because it helps to protect against heart disease. A number below 40 for men or below 50 for women increases the risk for heart disease.  Triglycerides. A healthy triglyceride number is below 150. If your lipid profile  is abnormal, your health care provider may do other blood tests. How is this treated? Treatment depends on the type of dyslipidemia that you have and your other risk factors for heart disease and stroke. Your health care provider will have a target range for your lipid levels based on this information. For many people, this condition may be treated by lifestyle changes, such as diet and exercise. Your health care provider may recommend that you:  Get regular exercise.  Make changes to your diet.  Quit smoking if you smoke. If diet changes and exercise do not help you reach your goals, your health care provider may also prescribe medicine to lower lipids. The most commonly prescribed type of medicine lowers your LDL cholesterol (statin drug). If you have a high triglyceride level, your provider may prescribe another type of drug (fibrate) or an omega-3 fish oil supplement, or both. Follow these instructions at home:  Eating and drinking  Follow instructions from your health care provider or dietitian about eating or drinking restrictions.  Eat a healthy diet as told by your health care provider. This can help you reach and maintain a healthy weight, lower your LDL cholesterol, and raise your HDL cholesterol. This may include: ? Limiting your calories, if you are overweight. ? Eating more fruits, vegetables, whole grains, fish, and lean meats. ? Limiting saturated fat, trans fat, and cholesterol.  If you drink alcohol: ? Limit how much you use. ? Be aware of how much alcohol is in your drink. In the [place] one drink equals one 12 oz bottle of beer (355 mL), one 5 oz glass of wine (148 mL), or one 1 oz glass of hard liquor (44 mL).  Do not drink alcohol if: ? Your health care provider tells you not to drink. ? You are pregnant, may be pregnant, or are planning to become pregnant. Activity  Get regular exercise. Start an exercise and strength training program as told by your health care  provider. Ask your health care provider what activities are safe for you. Your health care provider may recommend: ? 30 minutes of aerobic activity 4-6 days a week. Brisk walking is an example of aerobic activity. ? Strength training 2 days a week. General instructions  Do not use any products that contain nicotine  or tobacco, such as cigarettes, e-cigarettes, and chewing tobacco. If you need help quitting, ask your health care provider.  Take over-the-counter and prescription medicines only as told by your health care provider. This includes supplements.  Keep all follow-up visits as told by your health care provider. Contact a health care provider if:  You are: ? Having trouble sticking to your exercise or diet plan. ? Struggling to quit smoking or control your use of alcohol. Summary  Dyslipidemia often involves a high level of cholesterol or triglycerides, which are types of lipids.  Treatment depends on the type of dyslipidemia that you have and your other risk factors for heart disease and stroke.  For many people, treatment starts with lifestyle changes, such as diet and exercise.  Your health care provider may prescribe medicine to lower lipids. This information is not intended to replace advice given to you by your health care provider. Make sure you discuss any questions you have with your health care provider. Document Revised: 06/23/2018 Document Reviewed: 05/30/2018 Elsevier Patient Education  2020 Elsevier Inc.  Familial Hypercholesterolemia Familial hypercholesterolemia (FH) is a genetic disorder that causes a very high level of LDL (low-density lipoprotein) cholesterol. Cholesterol is a waxy, fat-like substance that your body needs to build cells. Your body makes all the cholesterol it needs in the liver and removes extra (excess) cholesterol from the blood as needed. Excess cholesterol comes from food that you eat. In people who have FH, the body is not able to remove  LDL cholesterol from the blood as it should. A high level of LDL cholesterol puts you at higher risk for narrowing and hardening of your arteries (atherosclerosis) at an early age. This raises your risk for heart disease and stroke. What are the causes? FH is passed from parent to child (inherited). FH is caused by an inherited gene defect (genetic mutation) that makes it hard for the liver to remove LDL cholesterol from the blood. The gene may be inherited from one parent or both parents. What increases the risk? You may be at higher risk for FH if:  You have a family history of the condition. If both parents carry the genetic mutation, their children are at higher risk for a more severe form of FH, with symptoms that start at an earlier age. What are the signs or symptoms? You may have a high level of LDL cholesterol before you develop symptoms. Symptoms of FH may include:  Cholesterol nodules (xanthomas) on the cords of tissue that connect muscles to bones (tendons). Xanthomas often form on the long tendon at the back of the ankle (Achilles tendon) or on the tendons on the back of the hands.  Cholesterol deposits (xanthelasmas) under the skin of the eyelids.  A gray or blue ring around the white part of the eye (corneal arcus). Complications of FH can occur due to atherosclerosis. Atherosclerosis may cause damage to an area of the body that is not getting enough blood. Complications of FH may include:  Chest pain (angina) and shortness of breath due to narrowed or blocked arteries in the heart (coronary artery disease).  Pain and cramping in the back of the lower legs (calves) when walking (claudication).  Interruption in blood flow to the brain (stroke). This may cause: ? Loss of balance. ? Vision loss. ? Sudden weakness or numbness on one side of the body. How is this diagnosed? This condition may be diagnosed based on:  Your symptoms.  Your medical history, including any family  history of FH or early coronary heart disease.  A physical exam.  A blood test to check for the genetic mutation that causes FH. Your family members may also be tested. How is this treated? There is no cure for FH, but treatment can lower LDL cholesterol levels and lower your risk for heart attack or stroke. Treatment should be started as soon as you are diagnosed. Treatment may include:  A type of medicine that lowers your cholesterol (statin). If statins do not help, your health care provider may try other kinds of cholesterol-lowering medicines. The exact combination of medicines depends on the severity of your symptoms.  A procedure to filter LDL from your blood (apheresis). You may need this treatment if you have a severe form of FH.  Making lifestyle changes that are healthy for your heart, such as lowering the amount of fat and cholesterol in your diet. Follow these instructions at home: Lifestyle   Lose weight, if directed by your health care provider.  Follow instructions from your health care provider about eating a healthy diet. Your health care provider may recommend: ? Working with a diet and nutrition specialist (dietitian), who can help you make a healthy eating plan and help you maintain a healthy weight. ? Eating less fat and cholesterol. Avoid fatty meats, fried foods, and whole-fat dairy. ? Eating more vegetables, fruits, and whole grains. ? Limiting your intake of alcohol.  Be physically active. Ask your health care provider what type of exercise is best for you.  Do not use any products that contain nicotine or tobacco, such as cigarettes and e-cigarettes. If you need help quitting, ask your health care provider.  Work with your health care provider to manage any other conditions you have, such as high blood pressure (hypertension) or diabetes. These conditions affect your heart. General instructions  Take over-the-counter and prescription medicines only as told  by your health care provider.  Keep all follow-up visits as told by your health care provider. This is important. Contact a health care provider if:  You have pain or cramps in your calf when you walk. Get help right away if:   You have sudden, unexplained discomfort in your chest, arms, back, neck, jaw, or upper body.  You have trouble breathing.  You have a sudden, severe headache with no known cause.  You have any symptoms of a stroke. "BE FAST" is an easy way to remember the main warning signs of a stroke: ? B - Balance. Signs are dizziness, sudden trouble walking, or loss of balance. ? E - Eyes. Signs are trouble seeing or a sudden change in vision. ? F - Face. Signs are sudden weakness or numbness of the face, or the face or eyelid drooping on one side. ? A - Arms. Signs are weakness or numbness in an arm. This happens suddenly and usually on one side of the body. ? S - Speech. Signs are sudden trouble speaking, slurred speech, or trouble understanding what people say. ? T - Time. Time to call emergency services. Write down what time symptoms started. These symptoms may represent a serious problem that is an emergency. Do not wait to see if the symptoms will go away. Get medical help right away. Call your local emergency services (911 in the [place] Do not drive yourself to the hospital. Summary  Familial hypercholesterolemia (FH) is a genetic disorder that causes a very high level of LDL (low-density lipoprotein) cholesterol.  FH increases your risk for coronary heart  disease and stroke at an early age.  Treatment for FH should be started as soon as you are diagnosed with the condition. Treatment is aimed at lowering your risk for complications.  Follow instructions from your health care provider about eating a healthy diet. Your health care provider may recommend eating less fat and cholesterol. This information is not intended to replace advice given to you by your health care  provider. Make sure you discuss any questions you have with your health care provider. Document Revised: 10/11/2017 Document Reviewed: 04/11/2017 Elsevier Patient Education  2020 [employer].  Fat and Cholesterol Restricted Eating Plan Eating a diet that limits fat and cholesterol may help lower your risk for heart disease and other conditions. Your body needs fat and cholesterol for basic functions, but eating too much of these things can be harmful to your health. Your health care provider may order lab tests to check your blood fat (lipid) and cholesterol levels. This helps your health care provider understand your risk for certain conditions and whether you need to make diet changes. Work with your health care provider or dietitian to make an eating plan that is right for you. Your plan includes:  Limit your fat intake to ______% or less of your total calories a day.  Limit your saturated fat intake to ______% or less of your total calories a day.  Limit the amount of cholesterol in your diet to less than _________mg a day.  Eat ___________ g of fiber a day. What are tips for following this plan? General guidelines   If you are overweight, work with your health care provider to lose weight safely. Losing just 5-10% of your body weight can improve your overall health and help prevent diseases such as diabetes and heart disease.  Avoid: ? Foods with added sugar. ? Fried foods. ? Foods that contain partially hydrogenated oils, including stick margarine, some tub margarines, cookies, crackers, and other baked goods.  Limit alcohol intake to no more than 1 drink a day for nonpregnant women and 2 drinks a day for men. One drink equals 12 oz of beer, 5 oz of wine, or 1 oz of hard liquor. Reading food labels  Check food labels for: ? Trans fats, partially hydrogenated oils, or high amounts of saturated fat. Avoid foods that contain saturated fat and trans fat. ? The amount of  cholesterol in each serving. Try to eat no more than 200 mg of cholesterol each day. ? The amount of fiber in each serving. Try to eat at least 20-30 g of fiber each day.  Choose foods with healthy fats, such as: ? Monounsaturated and polyunsaturated fats. These include olive and canola oil, flaxseeds, walnuts, almonds, and seeds. ? Omega-3 fats. These are found in foods such as salmon, mackerel, sardines, tuna, flaxseed oil, and ground flaxseeds.  Choose grain products that have whole grains. Look for the word "whole" as the first word in the ingredient list. Cooking  Cook foods using methods other than frying. Baking, boiling, grilling, and broiling are some healthy options.  Eat more home-cooked food and less restaurant, buffet, and fast food.  Avoid cooking using saturated fats. ? Animal sources of saturated fats include meats, butter, and cream. ? Plant sources of saturated fats include palm oil, palm kernel oil, and coconut oil. Meal planning   At meals, imagine dividing your plate into fourths: ? Fill one-half of your plate with vegetables and green salads. ? Fill one-fourth of your plate with whole  grains. ? Fill one-fourth of your plate with lean protein foods.  Eat fish that is high in omega-3 fats at least two times a week.  Eat more foods that contain fiber, such as whole grains, beans, apples, broccoli, carrots, peas, and barley. These foods help promote healthy cholesterol levels in the blood. Recommended foods Grains  Whole grains, such as whole wheat or whole grain breads, crackers, cereals, and pasta. Unsweetened oatmeal, bulgur, barley, quinoa, or brown rice. Corn or whole wheat flour tortillas. Vegetables  Fresh or frozen vegetables (raw, steamed, roasted, or grilled). Green salads. Fruits  All fresh, canned (in natural juice), or frozen fruits. Meats and other protein foods  Ground beef (85% or leaner), grass-fed beef, or beef trimmed of fat. Skinless  chicken or [place]. Ground chicken or [place]. Pork trimmed of fat. All fish and seafood. Egg whites. Dried beans, peas, or lentils. Unsalted nuts or seeds. Unsalted canned beans. Natural nut butters without added sugar and oil. Dairy  Low-fat or nonfat dairy products, such as skim or 1% milk, 2% or reduced-fat cheeses, low-fat and fat-free ricotta or cottage cheese, or plain low-fat and nonfat yogurt. Fats and oils  Tub margarine without trans fats. Light or reduced-fat mayonnaise and salad dressings. Avocado. Olive, canola, sesame, or safflower oils. The items listed above may not be a complete list of recommended foods or beverages. Contact your dietitian for more options. Foods to avoid Grains  White bread. White pasta. White rice. Cornbread. Bagels, pastries, and croissants. Crackers and snack foods that contain trans fat and hydrogenated oils. Vegetables  Vegetables cooked in cheese, cream, or butter sauce. Fried vegetables. Fruits  Canned fruit in heavy syrup. Fruit in cream or butter sauce. Fried fruit. Meats and other protein foods  Fatty cuts of meat. Ribs, chicken wings, bacon, sausage, bologna, salami, chitterlings, fatback, hot dogs, bratwurst, and packaged lunch meats. Liver and organ meats. Whole eggs and egg yolks. Chicken and [place] with skin. Fried meat. Dairy  Whole or 2% milk, cream, half-and-half, and cream cheese. Whole milk cheeses. Whole-fat or sweetened yogurt. Full-fat cheeses. Nondairy creamers and whipped toppings. Processed cheese, cheese spreads, and cheese curds. Beverages  Alcohol. Sugar-sweetened drinks such as sodas, lemonade, and fruit drinks. Fats and oils  Butter, stick margarine, lard, shortening, ghee, or bacon fat. Coconut, palm kernel, and palm oils. Sweets and desserts  Corn syrup, sugars, honey, and molasses. Candy. Jam and jelly. Syrup. Sweetened cereals. Cookies, pies, cakes, donuts, muffins, and ice cream. The items listed above may not be  a complete list of foods and beverages to avoid. Contact your dietitian for more information. Summary  Your body needs fat and cholesterol for basic functions. However, eating too much of these things can be harmful to your health.  Work with your health care provider and dietitian to follow a diet low in fat and cholesterol. Doing this may help lower your risk for heart disease and other conditions.  Choose healthy fats, such as monounsaturated and polyunsaturated fats, and foods high in omega-3 fatty acids.  Eat fiber-rich foods, such as whole grains, beans, peas, fruits, and vegetables.  Limit or avoid alcohol, fried foods, and foods high in saturated fats, partially hydrogenated oils, and sugar. This information is not intended to replace advice given to you by your health care provider. Make sure you discuss any questions you have with your health care provider. Document Revised: 10/11/2017 Document Reviewed: 07/16/2017 Elsevier Patient Education  2020 Else[employer] Inc.  Preventing High Cholesterol Cholesterol is a  white, waxy substance similar to fat that the human body needs to help build cells. The liver makes all the cholesterol that a person's body needs. Having high cholesterol (hypercholesterolemia) increases a person's risk for heart disease and stroke. Extra (excess) cholesterol comes from the food the person eats. High cholesterol can often be prevented with diet and lifestyle changes. If you already have high cholesterol, you can control it with diet and lifestyle changes and with medicine. How can high cholesterol affect me? If you have high cholesterol, deposits (plaques) may build up on the walls of your arteries. The arteries are the blood vessels that carry blood away from your heart. Plaques make the arteries narrower and stiffer. This can limit or block blood flow and cause blood clots to form. Blood clots:  Are tiny balls of cells that form in your blood.  Can move to  the heart or brain, causing a heart attack or stroke. Plaques in arteries greatly increase your risk for heart attack and stroke.Making diet and lifestyle changes can reduce your risk for these conditions that may threaten your life. What can increase my risk? This condition is more likely to develop in people who:  Eat foods that are high in saturated fat or cholesterol. Saturated fat is mostly found in: ? Foods that contain animal fat, such as red meat and some dairy products. ? Certain fatty foods made from plants, such as tropical oils.  Are overweight.  Are not getting enough exercise.  Have a family history of high cholesterol. What actions can I take to prevent this? Nutrition   Eat less saturated fat.  Avoid trans fats (partially hydrogenated oils). These are often found in margarine and in some baked goods, fried foods, and snacks bought in packages.  Avoid precooked or cured meat, such as sausages or meat loaves.  Avoid foods and drinks that have added sugars.  Eat more fruits, vegetables, and whole grains.  Choose healthy sources of protein, such as fish, poultry, lean cuts of red meat, beans, peas, lentils, and nuts.  Choose healthy sources of fat, such as: ? Nuts. ? Vegetable oils, especially olive oil. ? Fish that have healthy fats (omega-3 fatty acids), such as mackerel or salmon. The items listed above may not be a complete list of recommended foods and beverages. Contact a dietitian for more information. Lifestyle  Lose weight if you are overweight. Losing 5-10 lb (2.3-4.5 kg) can help prevent or control high cholesterol. It can also lower your risk for diabetes and high blood pressure. Ask your health care provider to help you with a diet and exercise plan to lose weight safely.  Do not use any products that contain nicotine or tobacco, such as cigarettes, e-cigarettes, and chewing tobacco. If you need help quitting, ask your health care provider.  Limit  your alcohol intake. ? Do not drink alcohol if:  Your health care provider tells you not to drink.  You are pregnant, may be pregnant, or are planning to become pregnant. ? If you drink alcohol:  Limit how much you use to:  0-1 drink a day for women.  0-2 drinks a day for men.  Be aware of how much alcohol is in your drink. In the [place] one drink equals one 12 oz bottle of beer (355 mL), one 5 oz glass of wine (148 mL), or one 1 oz glass of hard liquor (44 mL). Activity   Get enough exercise. Each week, do at least 150 minutes of exercise  that takes a medium level of effort (moderate-intensity exercise). ? This is exercise that:  Makes your heart beat faster and makes you breathe harder than usual.  Allows you to still be able to talk. ? You could exercise in short sessions several times a day or longer sessions a few times a week. For example, on 5 days each week, you could walk fast or ride your bike 3 times a day for 10 minutes each time.  Do exercises as told by your health care provider. Medicines  In addition to diet and lifestyle changes, your health care provider may recommend medicines to help lower cholesterol. This may be a medicine to lower the amount of cholesterol your liver makes. You may need medicine if: ? Diet and lifestyle changes do not lower your cholesterol enough. ? You have high cholesterol and other risk factors for heart disease or stroke.  Take over-the-counter and prescription medicines only as told by your health care provider. General information  Manage your risk factors for high cholesterol. Talk with your health care provider about all your risk factors and how to lower your risk.  Manage other conditions that you have, such as diabetes or high blood pressure (hypertension).  Have blood tests to check your cholesterol levels at regular points in time as told by your health care provider.  Keep all follow-up visits as told by your health care  provider. This is important. Where to find more information  American Heart Association: www.heart.org  National Heart, Lung, and Blood Institute: [employer].is Summary  High cholesterol increases your risk for heart disease and stroke. By keeping your cholesterol level low, you can reduce your risk for these conditions.  High cholesterol can often be prevented with diet and lifestyle changes.  Work with your health care provider to manage your risk factors, and have your blood tested regularly. This information is not intended to replace advice given to you by your health care provider. Make sure you discuss any questions you have with your health care provider. Document Revised: 02/20/2019 Document Reviewed: 07/07/2016 Elsevier Patient Education  2020 Elsevier Inc.  Triglycerides Test Why am I having this test? Triglycerides are a type of fat in the body. Having a high level of triglycerides can increase your risk for heart disease. You may have this test as part of a routine physical exam. Health care providers recommend that adults have this test at least once every 5 years. If you have risk factors for heart disease or are being treated for high triglycerides, you may need to have this test more often. What is being tested? This test measures the amount of triglycerides in your blood. Triglycerides are naturally present in the body, and you also take in triglycerides by eating certain foods. Triglycerides may be measured as part of a test called a lipid profile, which tests triglycerides and cholesterol levels. What kind of sample is taken?     A blood sample is required for this test. It may be collected by inserting a needle into a blood vessel, or by pricking a fingertip with a small needle (finger stick). How do I prepare for this test?  Follow instructions from your health care provider about changing or stopping your regular medicines.  Do not eat or drink anything  except water starting 9-12 hours before your test, or as long as told by your health care provider.  Do not drink alcohol starting at least 24 hours before your test.  Follow any instructions  from your health care provider about dietary restrictions before your test. Tell a health care provider about:  All medicines you are taking, including vitamins, herbs, eye drops, creams, and over-the-counter medicines.  Any blood disorders you have.  Any medical conditions you have. How are the results reported? Your test results will be reported as a value that indicates how many triglycerides are in your blood. This will be given as milligrams of triglycerides per deciliter of blood (mg/dL). Your health care provider will compare your results to normal values that were established after testing a large group of people (reference ranges). Reference ranges may vary among labs and hospitals. For this test, common reference ranges are:  Adults: ? Male: 40-160 mg/dL or 4.54-0.98 mmol/L (SI units). ? Male: 35-135 mg/dL or 1.19-1.47 mmol/L (SI units).  Teens 62-42 years old: ? Male: 40-163 mg/dL. ? Male: 40-128 mg/dL.  Children 70-42 years old: ? Male: 36-138 mg/dL. ? Male: 41-138 mg/dL.  Children 72-13 years old: ? Male: 31-108 mg/dL. ? Male: 35-114 mg/dL.  Children 5 years or younger: ? Male: 30-86 mg/dL. ? Male: 32-99 mg/dL. What do the results mean? Results that are within the reference range are considered normal. This means that you have a normal amount of triglycerides in your blood. Results that are higher than your reference range mean that there are too many triglycerides in your blood. This may mean that you:  Have a higher risk of heart disease.  Have certain diseases that cause high triglycerides, such as diabetes.  Are taking certain medicines such as estrogens and oral contraceptives. Results that are lower than your reference range mean that there are too few  triglycerides in your blood. This may mean that you are not getting enough nutrients in your diet (malnutrition). Talk with your health care provider about what your results mean. Questions to ask your health care provider Ask your health care provider, or the department that is doing the test:  When will my results be ready?  How will I get my results?  What are my treatment options?  What other tests do I need?  What are my next steps? Summary  Triglycerides are a type of fat in the body. Having a high level of triglycerides can increase your risk for heart disease.  You may have this test as part of a routine physical exam. Triglycerides may be measured as part of a test called a lipid profile, which tests triglycerides and cholesterol.  Talk with your health care provider about what your results mean. This information is not intended to replace advice given to you by your health care provider. Make sure you discuss any questions you have with your health care provider. Document Revised: 01/28/2018 Document Reviewed: 07/30/2017 Elsevier Patient Education  2020 Elsevier Inc.  High-Fiber Diet Fiber, also called dietary fiber, is a type of carbohydrate that is found in fruits, vegetables, whole grains, and beans. A high-fiber diet can have many health benefits. Your health care provider may recommend a high-fiber diet to help:  Prevent constipation. Fiber can make your bowel movements more regular.  Lower your cholesterol.  Relieve the following conditions: ? Swelling of veins in the anus (hemorrhoids). ? Swelling and irritation (inflammation) of specific areas of the digestive tract (uncomplicated diverticulosis). ? A problem of the large intestine (colon) that sometimes causes pain and diarrhea (irritable bowel syndrome, IBS).  Prevent overeating as part of a weight-loss plan.  Prevent heart disease, type 2 diabetes, and certain cancers. What  is my plan? The recommended  daily fiber intake in grams (g) includes:  38 g for men age 67 or younger.  30 g for men over age 66.  25 g for women age 20 or younger.  21 g for women over age 28. You can get the recommended daily intake of dietary fiber by:  Eating a variety of fruits, vegetables, grains, and beans.  Taking a fiber supplement, if it is not possible to get enough fiber through your diet. What do I need to know about a high-fiber diet?  It is better to get fiber through food sources rather than from fiber supplements. There is not a lot of research about how effective supplements are.  Always check the fiber content on the nutrition facts label of any prepackaged food. Look for foods that contain 5 g of fiber or more per serving.  Talk with a diet and nutrition specialist (dietitian) if you have questions about specific foods that are recommended or not recommended for your medical condition, especially if those foods are not listed below.  Gradually increase how much fiber you consume. If you increase your intake of dietary fiber too quickly, you may have bloating, cramping, or gas.  Drink plenty of water. Water helps you to digest fiber. What are tips for following this plan?  Eat a wide variety of high-fiber foods.  Make sure that half of the grains that you eat each day are whole grains.  Eat breads and cereals that are made with whole-grain flour instead of refined flour or white flour.  Eat brown rice, bulgur wheat, or millet instead of white rice.  Start the day with a breakfast that is high in fiber, such as a cereal that contains 5 g of fiber or more per serving.  Use beans in place of meat in soups, salads, and pasta dishes.  Eat high-fiber snacks, such as berries, raw vegetables, nuts, and popcorn.  Choose whole fruits and vegetables instead of processed forms like juice or sauce. What foods can I eat?  Fruits Berries. Pears. Apples. Oranges. Avocado. Prunes and raisins.  Dried figs. Vegetables Sweet potatoes. Spinach. Kale. Artichokes. Cabbage. Broccoli. Cauliflower. Green peas. Carrots. Squash. Grains Whole-grain breads. Multigrain cereal. Oats and oatmeal. Brown rice. Barley. Bulgur wheat. Millet. Quinoa. Bran muffins. Popcorn. Rye wafer crackers. Meats and other proteins Navy, kidney, and pinto beans. Soybeans. Split peas. Lentils. Nuts and seeds. Dairy Fiber-fortified yogurt. Beverages Fiber-fortified soy milk. Fiber-fortified orange juice. Other foods Fiber bars. The items listed above may not be a complete list of recommended foods and beverages. Contact a dietitian for more options. What foods are not recommended? Fruits Fruit juice. Cooked, strained fruit. Vegetables Fried potatoes. Canned vegetables. Well-cooked vegetables. Grains White bread. Pasta made with refined flour. White rice. Meats and other proteins Fatty cuts of meat. Fried chicken or fried fish. Dairy Milk. Yogurt. Cream cheese. Sour cream. Fats and oils Butters. Beverages Soft drinks. Other foods Cakes and pastries. The items listed above may not be a complete list of foods and beverages to avoid. Contact a dietitian for more information. Summary  Fiber is a type of carbohydrate. It is found in fruits, vegetables, whole grains, and beans.  There are many health benefits of eating a high-fiber diet, such as preventing constipation, lowering blood cholesterol, helping with weight loss, and reducing your risk of heart disease, diabetes, and certain cancers.  Gradually increase your intake of fiber. Increasing too fast can result in cramping, bloating, and gas. Drink  plenty of water while you increase your fiber.  The best sources of fiber include whole fruits and vegetables, whole grains, nuts, seeds, and beans. This information is not intended to replace advice given to you by your health care provider. Make sure you discuss any questions you have with your health care  provider. Document Revised: 09/02/2017 Document Reviewed: 09/02/2017 Elsevier Patient Education  2020 [employer].

## 2020-12-29 ENCOUNTER — Other Ambulatory Visit: Payer: Self-pay | Admitting: Registered Nurse

## 2020-12-29 DIAGNOSIS — E782 Mixed hyperlipidemia: Secondary | ICD-10-CM

## 2020-12-29 MED ORDER — COLESEVELAM HCL 625 MG PO TABS: 625.0000 mg | ORAL_TABLET | Freq: Two times a day (BID) | ORAL | 3 refills | Status: DC

## 2020-12-29 NOTE — Telephone Encounter (Signed)
Requested Prescriptions  Pending Prescriptions Disp Refills  . colesevelam (WELCHOL) 625 MG tablet 180 tablet 3    Sig: Take 1 tablet (625 mg total) by mouth 2 (two) times daily with a meal.     Cardiovascular:  Antilipid - Bile Acid Sequestrants Failed - 12/29/2020 12:55 PM      Failed - Total Cholesterol in normal range and within 360 days    Cholesterol, Total  Date Value Ref Range Status  11/09/2020 218 (H) 100 - 199 mg/dL Final         Failed - LDL in normal range and within 360 days    LDL Chol Calc (NIH)  Date Value Ref Range Status  11/09/2020 124 (H) 0 - 99 mg/dL Final         Failed - HDL in normal range and within 360 days    HDL  Date Value Ref Range Status  11/09/2020 30 (L) >39 mg/dL Final         Failed - Triglycerides in normal range and within 360 days    Triglycerides  Date Value Ref Range Status  11/09/2020 358 (H) 0 - 149 mg/dL Final         Passed - Valid encounter within last 12 months    Recent Outpatient Visits          1 month ago Mixed hyperlipidemia   [facility], Gerlene Burdock, NP   4 months ago Need for prophylactic vaccination and inoculation against influenza   [facility], Gerlene Burdock, NP   10 months ago Screening for endocrine, metabolic and immunity disorder   [facility], Gerlene Burdock, NP      Future Appointments            In 4 weeks Janeece Agee, NP [facility] [place], [facility]

## 2020-12-29 NOTE — Telephone Encounter (Signed)
Medication: colesevelam (WELCHOL) 625 MG tablet  Has the pt contacted their pharmacy? Yes but pt states his insurance is requiring him to get his Rx from mail order  Preferred pharmacy: [facility] [place],  - [address] [place], [address]  Can you transfer this Rx asap? Pt states he really needs.  Please be advised refills may take up to 3 business days.  We ask that you follow up with your pharmacy.

## 2021-01-18 ENCOUNTER — Ambulatory Visit (INDEPENDENT_AMBULATORY_CARE_PROVIDER_SITE_OTHER): Payer: No Typology Code available for payment source | Admitting: Registered Nurse

## 2021-01-18 ENCOUNTER — Other Ambulatory Visit: Payer: Self-pay

## 2021-01-18 DIAGNOSIS — Z0000 Encounter for general adult medical examination without abnormal findings: Secondary | ICD-10-CM

## 2021-01-19 ENCOUNTER — Other Ambulatory Visit: Payer: Self-pay | Admitting: Registered Nurse

## 2021-01-19 DIAGNOSIS — E782 Mixed hyperlipidemia: Secondary | ICD-10-CM

## 2021-01-19 LAB — COMPREHENSIVE METABOLIC PANEL
ALT: 63 IU/L — ABNORMAL HIGH (ref 0–44)
AST: 30 IU/L (ref 0–40)
Albumin/Globulin Ratio: 2 (ref 1.2–2.2)
Albumin: 4.3 g/dL (ref 4.0–5.0)
Alkaline Phosphatase: 64 IU/L (ref 44–121)
BUN/Creatinine Ratio: 11 (ref 9–20)
BUN: 8 mg/dL (ref 6–24)
Bilirubin Total: 0.9 mg/dL (ref 0.0–1.2)
CO2: 23 mmol/L (ref 20–29)
Calcium: 9.2 mg/dL (ref 8.7–10.2)
Chloride: 103 mmol/L (ref 96–106)
Creatinine, Ser: 0.73 mg/dL — ABNORMAL LOW (ref 0.76–1.27)
Globulin, Total: 2.2 g/dL (ref 1.5–4.5)
Glucose: 92 mg/dL (ref 65–99)
Potassium: 4.1 mmol/L (ref 3.5–5.2)
Sodium: 139 mmol/L (ref 134–144)
Total Protein: 6.5 g/dL (ref 6.0–8.5)
eGFR: 114 mL/min/{1.73_m2} (ref >59)

## 2021-01-19 LAB — CBC WITH DIFFERENTIAL/PLATELET
Basophils Absolute: 0.1 10*3/uL (ref 0.0–0.2)
Basos: 1 %
EOS (ABSOLUTE): 0.1 10*3/uL (ref 0.0–0.4)
Eos: 2 %
Hematocrit: 42.6 % (ref 37.5–51.0)
Hemoglobin: 14.2 g/dL (ref 13.0–17.7)
Immature Grans (Abs): 0.1 10*3/uL (ref 0.0–0.1)
Immature Granulocytes: 1 %
Lymphocytes Absolute: 1.3 10*3/uL (ref 0.7–3.1)
Lymphs: 22 %
MCH: 28.9 pg (ref 26.6–33.0)
MCHC: 33.3 g/dL (ref 31.5–35.7)
MCV: 87 fL (ref 79–97)
Monocytes Absolute: 0.6 10*3/uL (ref 0.1–0.9)
Monocytes: 11 %
Neutrophils Absolute: 3.7 10*3/uL (ref 1.4–7.0)
Neutrophils: 63 %
Platelets: 295 10*3/uL (ref 150–450)
RBC: 4.91 x10E6/uL (ref 4.14–5.80)
RDW: 13.1 % (ref 11.6–15.4)
WBC: 5.8 10*3/uL (ref 3.4–10.8)

## 2021-01-19 LAB — LIPID PANEL
Chol/HDL Ratio: 8.7 ratio — ABNORMAL HIGH (ref 0.0–5.0)
Cholesterol, Total: 251 mg/dL — ABNORMAL HIGH (ref 100–199)
HDL: 29 mg/dL — ABNORMAL LOW (ref >39)
LDL Chol Calc (NIH): 165 mg/dL — ABNORMAL HIGH (ref 0–99)
Triglycerides: 300 mg/dL — ABNORMAL HIGH (ref 0–149)
VLDL Cholesterol Cal: 57 mg/dL — ABNORMAL HIGH (ref 5–40)

## 2021-01-20 ENCOUNTER — Ambulatory Visit (INDEPENDENT_AMBULATORY_CARE_PROVIDER_SITE_OTHER): Payer: No Typology Code available for payment source | Admitting: Registered Nurse

## 2021-01-20 ENCOUNTER — Encounter: Payer: Self-pay | Admitting: Registered Nurse

## 2021-01-20 ENCOUNTER — Other Ambulatory Visit: Payer: Self-pay

## 2021-01-20 DIAGNOSIS — E782 Mixed hyperlipidemia: Secondary | ICD-10-CM

## 2021-01-20 MED ORDER — COLESEVELAM HCL 625 MG PO TABS: 625.0000 mg | ORAL_TABLET | Freq: Every day | ORAL | 3 refills | Status: DC

## 2021-01-20 NOTE — Patient Instructions (Signed)
° ° ° °  If you have lab work done today you will be contacted with your lab results within the next 2 weeks.  If you have not heard from us then please contact us. The fastest way to get your results is to register for My Chart. ° ° °IF you received an x-ray today, you will receive an invoice from [facility]. Please contact [facility] at [phone] with questions or concerns regarding your invoice.  ° °IF you received labwork today, you will receive an invoice from [facility]. Please contact [facility] at [phone] with questions or concerns regarding your invoice.  ° °Our billing staff will not be able to assist you with questions regarding bills from these companies. ° °You will be contacted with the lab results as soon as they are available. The fastest way to get your results is to activate your My Chart account. Instructions are located on the last page of this paperwork. If you have not heard from us regarding the results in 2 weeks, please contact this office. °  ° ° ° °

## 2021-01-23 ENCOUNTER — Ambulatory Visit: Payer: No Typology Code available for payment source | Admitting: Registered Nurse

## 2021-01-23 ENCOUNTER — Ambulatory Visit: Payer: No Typology Code available for payment source

## 2021-01-24 ENCOUNTER — Ambulatory Visit: Payer: No Typology Code available for payment source

## 2021-01-27 ENCOUNTER — Ambulatory Visit: Payer: No Typology Code available for payment source | Admitting: Registered Nurse

## 2021-03-24 ENCOUNTER — Encounter: Payer: Self-pay | Admitting: Registered Nurse

## 2021-03-24 ENCOUNTER — Ambulatory Visit (INDEPENDENT_AMBULATORY_CARE_PROVIDER_SITE_OTHER): Payer: No Typology Code available for payment source | Admitting: Registered Nurse

## 2021-03-24 ENCOUNTER — Other Ambulatory Visit: Payer: Self-pay

## 2021-03-24 VITALS — BP 123/84 | HR 76 | Temp 98.3°F | Resp 18 | Ht 61.0 in | Wt 133.0 lb

## 2021-03-24 DIAGNOSIS — Z1329 Encounter for screening for other suspected endocrine disorder: Secondary | ICD-10-CM

## 2021-03-24 DIAGNOSIS — E782 Mixed hyperlipidemia: Secondary | ICD-10-CM | POA: Diagnosis not present

## 2021-03-24 DIAGNOSIS — W19XXXA Unspecified fall, initial encounter: Secondary | ICD-10-CM

## 2021-03-24 DIAGNOSIS — H938X2 Other specified disorders of left ear: Secondary | ICD-10-CM

## 2021-03-24 DIAGNOSIS — Z130 Encounter for screening for diseases of the blood and blood-forming organs and certain disorders involving the immune mechanism: Secondary | ICD-10-CM

## 2021-03-24 DIAGNOSIS — Z13228 Encounter for screening for other metabolic disorders: Secondary | ICD-10-CM | POA: Diagnosis not present

## 2021-03-24 LAB — COMPREHENSIVE METABOLIC PANEL
ALT: 30 U/L (ref 0–53)
AST: 27 U/L (ref 0–37)
Albumin: 4.5 g/dL (ref 3.5–5.2)
Alkaline Phosphatase: 56 U/L (ref 39–117)
BUN: 9 mg/dL (ref 6–23)
CO2: 28 mEq/L (ref 19–32)
Calcium: 9.6 mg/dL (ref 8.4–10.5)
Chloride: 103 mEq/L (ref 96–112)
Creatinine, Ser: 0.75 mg/dL (ref 0.40–1.50)
GFR: 108.43 mL/min (ref >60.00)
Glucose, Bld: 84 mg/dL (ref 70–99)
Potassium: 4.1 mEq/L (ref 3.5–5.1)
Sodium: 138 mEq/L (ref 135–145)
Total Bilirubin: 0.9 mg/dL (ref 0.2–1.2)
Total Protein: 7.3 g/dL (ref 6.0–8.3)

## 2021-03-24 LAB — LIPID PANEL
Cholesterol: 230 mg/dL — ABNORMAL HIGH (ref 0–200)
HDL: 32 mg/dL — ABNORMAL LOW (ref >39.00)
LDL Cholesterol: 166 mg/dL — ABNORMAL HIGH (ref 0–99)
NonHDL: 197.59
Total CHOL/HDL Ratio: 7
Triglycerides: 157 mg/dL — ABNORMAL HIGH (ref 0.0–149.0)
VLDL: 31.4 mg/dL (ref 0.0–40.0)

## 2021-03-24 LAB — CBC WITH DIFFERENTIAL/PLATELET
Basophils Absolute: 0.1 10*3/uL (ref 0.0–0.1)
Basophils Relative: 1.1 % (ref 0.0–3.0)
Eosinophils Absolute: 0.2 10*3/uL (ref 0.0–0.7)
Eosinophils Relative: 2.8 % (ref 0.0–5.0)
HCT: 43.2 % (ref 39.0–52.0)
Hemoglobin: 14.6 g/dL (ref 13.0–17.0)
Lymphocytes Relative: 21.5 % (ref 12.0–46.0)
Lymphs Abs: 1.2 10*3/uL (ref 0.7–4.0)
MCHC: 33.8 g/dL (ref 30.0–36.0)
MCV: 86.1 fl (ref 78.0–100.0)
Monocytes Absolute: 0.6 10*3/uL (ref 0.1–1.0)
Monocytes Relative: 11.1 % (ref 3.0–12.0)
Neutro Abs: 3.6 10*3/uL (ref 1.4–7.7)
Neutrophils Relative %: 63.5 % (ref 43.0–77.0)
Platelets: 345 10*3/uL (ref 150.0–400.0)
RBC: 5.03 Mil/uL (ref 4.22–5.81)
RDW: 13.2 % (ref 11.5–15.5)
WBC: 5.6 10*3/uL (ref 4.0–10.5)

## 2021-03-24 LAB — TSH: TSH: 4.51 u[IU]/mL — ABNORMAL HIGH (ref 0.35–4.50)

## 2021-03-24 LAB — HEMOGLOBIN A1C: Hgb A1c MFr Bld: 5.1 % (ref 4.6–6.5)

## 2021-03-24 MED ORDER — HYDROCORTISONE-ACETIC ACID 1-2 % OT SOLN: 3.0000 [drp] | Freq: Three times a day (TID) | OTIC | 0 refills | Status: DC

## 2021-03-24 MED ORDER — CETIRIZINE HCL 10 MG PO TABS: 10.0000 mg | ORAL_TABLET | Freq: Every day | ORAL | 11 refills | Status: DC

## 2021-03-24 MED ORDER — DICLOFENAC SODIUM 75 MG PO TBEC: 75.0000 mg | DELAYED_RELEASE_TABLET | Freq: Two times a day (BID) | ORAL | 0 refills | Status: DC

## 2021-03-24 NOTE — Progress Notes (Signed)
Established Patient Office Visit  Subjective:  Patient ID: Darryl Jackson, male    DOB: 1976/02/06  Age: 45 y.o. MRN: [id number]  CC:  Chief Complaint  Patient presents with  . Follow-up    Patient states he just came back from [place] and would like bloodwork    HPI Darryl Jackson presents for routine follow up  Returned from travel to [place], his home, recently.   Has not been taking statin as his diet has been a lot better. No symptoms of concern  Does note he had a mechanical fall around 1 week ago Hit L side of head. Tender and a bump formed. No cognitive changes.  No headaches or visual changes.  Some pain with jaw movement.  Does have some pressure in ear since returning from travel L>R No drainage No change to hearing  No past medical history on file.  Past Surgical History:  Procedure Laterality Date  . COLONOSCOPY    . HERNIA REPAIR      Family History  Problem Relation Age of Onset  . Diabetes Mother   . Healthy Father   . Healthy Sister     Social History   Socioeconomic History  . Marital status: Married    Spouse name: Not on file  . Number of children: Not on file  . Years of education: Not on file  . Highest education level: Not on file  Occupational History  . Not on file  Tobacco Use  . Smoking status: Never Smoker  . Smokeless tobacco: Never Used  Vaping Use  . Vaping Use: Never used  Substance and Sexual Activity  . Alcohol use: Never  . Drug use: Never  . Sexual activity: Yes  Other Topics Concern  . Not on file  Social History Narrative  . Not on file   Social Determinants of Health   Financial Resource Strain: Not on file  Food Insecurity: Not on file  Transportation Needs: Not on file  Physical Activity: Not on file  Stress: Not on file  Social Connections: Not on file  Intimate Partner Violence: Not on file    Outpatient Medications Prior to Visit  Medication Sig Dispense Refill  . colesevelam  (WELCHOL) 625 MG tablet Take 1 tablet (625 mg total) by mouth daily with breakfast. 180 tablet 3  . omega-3 acid ethyl esters (LOVAZA) 1 g capsule Take 1 capsule (1 g total) by mouth 2 (two) times daily. 180 capsule 1  . psyllium (REGULOID) 0.52 g capsule Take 1 capsule (0.52 g total) by mouth daily. 180 capsule 1   No facility-administered medications prior to visit.    No Known Allergies  ROS Review of Systems  Constitutional: Negative.   HENT: Positive for ear pain.   Eyes: Negative.   Respiratory: Negative.   Cardiovascular: Negative.   Gastrointestinal: Negative.   Endocrine: Negative.   Genitourinary: Negative.   Musculoskeletal: Positive for myalgias. Negative for arthralgias, back pain, gait problem, joint swelling, neck pain and neck stiffness.  Skin: Negative.   Allergic/Immunologic: Negative.   Neurological: Negative.   Hematological: Negative.   Psychiatric/Behavioral: Negative.   All other systems reviewed and are negative.     Objective:    Physical Exam Constitutional:      General: He is not in acute distress.    Appearance: Normal appearance. He is normal weight. He is not ill-appearing, toxic-appearing or diaphoretic.  Cardiovascular:     Rate and Rhythm: Normal rate and regular rhythm.  Heart sounds: Normal heart sounds. No murmur heard. No friction rub. No gallop.   Pulmonary:     Effort: Pulmonary effort is normal. No respiratory distress.     Breath sounds: Normal breath sounds. No stridor. No wheezing, rhonchi or rales.  Chest:     Chest wall: No tenderness.  Musculoskeletal:        General: Tenderness (head, l side. mild swelling) present.  Skin:    General: Skin is warm and dry.  Neurological:     General: No focal deficit present.     Mental Status: He is alert and oriented to person, place, and time. Mental status is at baseline.  Psychiatric:        Mood and Affect: Mood normal.        Behavior: Behavior normal.        Thought  Content: Thought content normal.        Judgment: Judgment normal.     BP 123/84   Pulse 76   Temp 98.3 F (36.8 C)   Resp 18   Ht 5' 1" (1.549 m)   Wt 133 lb (60.3 kg)   SpO2 99%   BMI 25.13 kg/m  Wt Readings from Last 3 Encounters:  03/24/21 133 lb (60.3 kg)  01/20/21 137 lb 6.4 oz (62.3 kg)  11/16/20 134 lb (60.8 kg)     There are no preventive care reminders to display for this patient.  There are no preventive care reminders to display for this patient.  Lab Results  Component Value Date   TSH 3.710 02/10/2020   Lab Results  Component Value Date   WBC 5.8 01/18/2021   HGB 14.2 01/18/2021   HCT 42.6 01/18/2021   MCV 87 01/18/2021   PLT 295 01/18/2021   Lab Results  Component Value Date   NA 139 01/18/2021   K 4.1 01/18/2021   CO2 23 01/18/2021   GLUCOSE 92 01/18/2021   BUN 8 01/18/2021   CREATININE 0.73 (L) 01/18/2021   BILITOT 0.9 01/18/2021   ALKPHOS 64 01/18/2021   AST 30 01/18/2021   ALT 63 (H) 01/18/2021   PROT 6.5 01/18/2021   ALBUMIN 4.3 01/18/2021   CALCIUM 9.2 01/18/2021   EGFR 114 01/18/2021   Lab Results  Component Value Date   CHOL 251 (H) 01/18/2021   Lab Results  Component Value Date   HDL 29 (L) 01/18/2021   Lab Results  Component Value Date   LDLCALC 165 (H) 01/18/2021   Lab Results  Component Value Date   TRIG 300 (H) 01/18/2021   Lab Results  Component Value Date   CHOLHDL 8.7 (H) 01/18/2021   Lab Results  Component Value Date   HGBA1C 5.2 08/05/2020      Assessment & Plan:   Problem List Items Addressed This Visit   None     No orders of the defined types were placed in this encounter.   Follow-up: No follow-ups on file.    Maximiano Coss, NP

## 2021-03-24 NOTE — Patient Instructions (Addendum)
Mr. Ledyard -  As always, a pleasure to see you. Glad your travels went well.   In brief:  Diclofenac 75mg  by mouth once or twice daily as needed for pain. This is an antiinflammatory. It may cause upset stomach if taken on an empty stomach.  Cetirizine 10mg  by mouth once daily as needed for allergy symptoms. This is an antihistamine. No side effects expected.  Vosol-HC 3 drops each ear up to three times daily as needed. This is a very mild ear drop that can help with your symptoms. No side effects expected.   Lab results should be back today or early tomorrow. I will call you with any concerns.   Thank you  Rich     If you have lab work done today you will be contacted with your lab results within the next 2 weeks.  If you have not heard from then please contact . The fastest way to get your results is to register for My Chart.   IF you received an x-ray today, you will receive an invoice from [facility]. Please contact [facility] at [phone] with questions or concerns regarding your invoice.   IF you received labwork today, you will receive an invoice from [facility]. Please contact [facility] at [phone] with questions or concerns regarding your invoice.   Our billing staff will not be able to assist you with questions regarding bills from these companies.  You will be contacted with the lab results as soon as they are available. The fastest way to get your results is to activate your My Chart account. Instructions are located on the last page of this paperwork. If you have not heard from [facility] regarding the results in 2 weeks, please contact this office.

## 2021-05-16 ENCOUNTER — Ambulatory Visit (INDEPENDENT_AMBULATORY_CARE_PROVIDER_SITE_OTHER): Payer: No Typology Code available for payment source | Admitting: Internal Medicine

## 2021-05-16 ENCOUNTER — Other Ambulatory Visit: Payer: Self-pay

## 2021-05-16 ENCOUNTER — Encounter (HOSPITAL_BASED_OUTPATIENT_CLINIC_OR_DEPARTMENT_OTHER): Payer: Self-pay | Admitting: Internal Medicine

## 2021-05-16 VITALS — BP 112/78 | HR 80 | Ht 62.0 in | Wt 136.2 lb

## 2021-05-16 DIAGNOSIS — E785 Hyperlipidemia, unspecified: Secondary | ICD-10-CM

## 2021-05-16 MED ORDER — ATORVASTATIN CALCIUM 40 MG PO TABS: 40.0000 mg | ORAL_TABLET | Freq: Every day | ORAL | 3 refills | Status: DC

## 2021-05-16 NOTE — Patient Instructions (Signed)
Medication Instructions:  START atorvastatin 40mg  daily  *If you need a refill on your cardiac medications before your next appointment, please call your pharmacy*   Lab Work: FASTING lab work in 3-4 months to check cholesterol  -- complete about 1 week before your next visiti @ any [facility]  If you have labs (blood work) drawn today and your tests are completely normal, you will receive your results only by: MyChart Message (if you have MyChart) OR A paper copy in the mail If you have any lab test that is abnormal or we need to change your treatment, we will call you to review the results.   Testing/Procedures: NONE   Follow-Up: At [facility], you and your health needs are our priority.  As part of our continuing mission to provide you with exceptional heart care, we have created designated Provider Care Teams.  These Care Teams include your primary Cardiologist (physician) and Advanced Practice Providers (APPs -  Physician Assistants and Nurse Practitioners) who all work together to provide you with the care you need, when you need it.  We recommend signing up for the patient portal called "MyChart".  Sign up information is provided on this After Visit Summary.  MyChart is used to connect with patients for Virtual Visits (Telemedicine).  Patients are able to view lab/test results, encounter notes, upcoming appointments, etc.  Non-urgent messages can be sent to your provider as well.   To learn more about what you can do with MyChart, go to [facility].    Your next appointment:   3-4 month(s) -- lipid clinic  The format for your next appointment:   In Person  Provider:   [email or web address] Hilty, MD   Other Instructions

## 2021-05-16 NOTE — Progress Notes (Signed)
LIPID CLINIC CONSULT NOTE  Chief Complaint:  Manage dyslipidemia  Primary Care Physician: Darryl Agee, NP  Primary Cardiologist:  None  HPI:  Darryl Jackson is a 45 y.o. male who is being seen today for the evaluation of dyslipidemia at the request of Darryl Agee, NP.  This is a pleasant 45 year old [place] Asian male with a history of elevated cholesterol.  In the past he has been on both Lovaza and WelChol but has had elevated cholesterol.  He says he is never taken a statin.  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.  Recently had a lipid profile showing total cholesterol 230, HDL 32, triglycerides 157 and LDL 166.  He does not report any early onset heart disease in his family although his father was deceased at an earlier age, presumably not of heart disease.  He also reported hypertension and diabetes in his mother.  His blood pressure appears normal, weight is normal and he does not have any history of diabetes or tobacco use.  He says he exercises pretty regularly.  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  PMHx:  No past medical history on file.  Past Surgical History:  Procedure Laterality Date   COLONOSCOPY     HERNIA REPAIR      FAMHx:  Family History  Problem Relation Age of Onset   Diabetes Mother    Healthy Father    Healthy Sister     SOCHx:   reports that he has never smoked. He has never used smokeless tobacco. He reports that he does not drink alcohol and does not use drugs.  ALLERGIES:  No Known Allergies  ROS: Pertinent items noted in HPI and remainder of comprehensive ROS otherwise negative.  HOME MEDS: Current Outpatient Medications on File Prior to Visit  Medication Sig Dispense Refill   acetic acid-hydrocortisone (VOSOL-HC) OTIC solution Place 3 drops into both ears 3 (three) times daily. 10 mL 0   cetirizine (ZYRTEC) 10 MG tablet  Take 1 tablet (10 mg total) by mouth daily. 30 tablet 11   colesevelam (WELCHOL) 625 MG tablet Take 1 tablet (625 mg total) by mouth daily with breakfast. 180 tablet 3   diclofenac (VOLTAREN) 75 MG EC tablet Take 1 tablet (75 mg total) by mouth 2 (two) times daily. 30 tablet 0   omega-3 acid ethyl esters (LOVAZA) 1 g capsule Take 1 capsule (1 g total) by mouth 2 (two) times daily. 180 capsule 1   psyllium (REGULOID) 0.52 g capsule Take 1 capsule (0.52 g total) by mouth daily. 180 capsule 1   No current facility-administered medications on file prior to visit.    LABS/IMAGING: No results found for this or any previous visit (from the past 48 hour(s)). No results found.  LIPID PANEL:    Component Value Date/Time   CHOL 230 (H) 03/24/2021 0901   CHOL 251 (H) 01/18/2021 0906   TRIG 157.0 (H) 03/24/2021 0901   HDL 32.00 (L) 03/24/2021 0901   HDL 29 (L) 01/18/2021 0906   CHOLHDL 7 03/24/2021 0901   VLDL 31.4 03/24/2021 0901   LDLCALC 166 (H) 03/24/2021 0901   LDLCALC 165 (H) 01/18/2021 0906    WEIGHTS: Wt Readings from Last 3 Encounters:  05/16/21 136 lb 3.2 oz (61.8 kg)  03/24/21 133 lb (60.3 kg)  01/20/21 137 lb 6.4 oz (62.3 kg)    VITALS: BP 112/78  Pulse 80   Ht 5\' 2"  (1.575 m)   Wt 136 lb 3.2 oz (61.8 kg)   SpO2 99%   BMI 24.91 kg/m   EXAM: General appearance: alert, no distress, and thin Neck: no carotid bruit, no JVD, and thyroid not enlarged, symmetric, no tenderness/mass/nodules Lungs: clear to auscultation bilaterally Heart: regular rate and rhythm, S1, S2 normal, no murmur, click, rub or gallop Abdomen: soft, non-tender; bowel sounds normal; no masses,  no organomegaly Extremities: extremities normal, atraumatic, no cyanosis or edema Pulses: 2+ and symmetric Skin: Skin color, texture, turgor normal. No rashes or lesions Neurologic: Grossly normal Psych: Pleasant  EKG: Deferred  ASSESSMENT: Mixed dyslipidemia Low to intermediate cardiovascular  risk  PLAN: 1.   Mr. Full has elevated cholesterol but has made significant dietary changes, is of normal weight and exercises regularly.  I suspect a lot may be genetic but probably not a familial hyperlipidemia based on his family history.  He will likely need statin therapy to reach a target LDL less than 100.  His risk of cardiovascular disease seems low however cholesterol is higher than target.  Would recommend high potency atorvastatin 40 mg daily which should reach his target.  Plan repeat lipids in about 3 to 4 months and follow-up at that time.  Thanks for the kind referral.  Seward Carol, MD, [facility]  [place]  [facility]  Medical Director of the Advanced Lipid Disorders &  Cardiovascular Risk Reduction Clinic Diplomate of the American Board of Clinical Lipidology Attending Cardiologist  Direct Dial: [phone]  Fax: [phone]  Website:  [email or web address] Brighten Buzzelli 05/16/2021, 2:44 PM

## 2021-09-14 LAB — NMR, LIPOPROFILE
Cholesterol, Total: 244 mg/dL — ABNORMAL HIGH (ref 100–199)
HDL Particle Number: 30.8 umol/L (ref >=30.5)
HDL-C: 46 mg/dL (ref >39)
LDL Particle Number: 2401 nmol/L — ABNORMAL HIGH (ref <1000)
LDL Size: 20.3 nm — ABNORMAL LOW (ref >20.5)
LDL-C (NIH Calc): 184 mg/dL — ABNORMAL HIGH (ref 0–99)
LP-IR Score: 65 — ABNORMAL HIGH (ref <=45)
Small LDL Particle Number: 1434 nmol/L — ABNORMAL HIGH (ref <=527)
Triglycerides: 80 mg/dL (ref 0–149)

## 2021-09-15 ENCOUNTER — Telehealth (INDEPENDENT_AMBULATORY_CARE_PROVIDER_SITE_OTHER): Payer: No Typology Code available for payment source | Admitting: Internal Medicine

## 2021-09-15 ENCOUNTER — Encounter (HOSPITAL_BASED_OUTPATIENT_CLINIC_OR_DEPARTMENT_OTHER): Payer: Self-pay | Admitting: Internal Medicine

## 2021-09-15 DIAGNOSIS — E785 Hyperlipidemia, unspecified: Secondary | ICD-10-CM

## 2021-09-15 MED ORDER — ATORVASTATIN CALCIUM 40 MG PO TABS: 40.0000 mg | ORAL_TABLET | Freq: Every day | ORAL | 3 refills | Status: DC

## 2021-09-15 NOTE — Addendum Note (Signed)
Addended by: Lindell Spar on: 09/15/2021 04:20 PM   Modules accepted: Orders

## 2021-09-15 NOTE — Progress Notes (Addendum)
Virtual Visit via Video Note   This visit type was conducted due to national recommendations for restrictions regarding the COVID-19 Pandemic (e.g. social distancing) in an effort to limit this patient's exposure and mitigate transmission in our community.  Due to his co-morbid illnesses, this patient is at least at moderate risk for complications without adequate follow up.  This format is felt to be most appropriate for this patient at this time.  All issues noted in this document were discussed and addressed.  A limited physical exam was performed with this format.  Please refer to the patient's chart for his consent to telehealth for [facility].      Date:  09/15/2021   ID:  Darryl Jackson, DOB [birth year], MRN [id number] The patient was identified using 2 identifiers.  Evaluation Performed:  Follow-Up Visit  Patient Location:  [address] [place] [address]  Provider location:   [address], [address] [place], [place] [address]  PCP:  Janeece Agee, NP  Cardiologist:  None Electrophysiologist:  None   Chief Complaint:  Follow-up dyslipidemia  History of Present Illness:    Darryl Jackson is a 45 y.o. male who presents via Web designer for a telehealth visit today.  Darryl returns today for follow-up of dyslipidemia.  He underwent NMR particle testing which showed an LDL P of 2401, LDL-C of 184, HDL 46 and triglycerides of 80 which has come down significantly from over 600 in the past.  He reports having had made dietary changes however he did not start the atorvastatin 40 mg daily as I had recommended.  He wanted to see first what he could do with diet.  Although triglycerides have improved, there is still an issue with elevated LDL cholesterol and he will need medication therapy.  The patient does not have symptoms concerning for COVID-19 infection (fever, chills, cough, or new SHORTNESS OF BREATH).    Prior CV  studies:   The following studies were reviewed today:  Chart reviewed, lab work  PMHx:  No past medical history on file.  Past Surgical History:  Procedure Laterality Date   COLONOSCOPY     HERNIA REPAIR      FAMHx:  Family History  Problem Relation Age of Onset   Diabetes Mother    Healthy Father    Healthy Sister     SOCHx:   reports that he has never smoked. He has never used smokeless tobacco. He reports that he does not drink alcohol and does not use drugs.  ALLERGIES:  No Known Allergies  MEDS:  Current Meds  Medication Sig   acetic acid-hydrocortisone (VOSOL-HC) OTIC solution Place 3 drops into both ears 3 (three) times daily.   cetirizine (ZYRTEC) 10 MG tablet Take 1 tablet (10 mg total) by mouth daily.   diclofenac (VOLTAREN) 75 MG EC tablet Take 1 tablet (75 mg total) by mouth 2 (two) times daily.   psyllium (REGULOID) 0.52 g capsule Take 1 capsule (0.52 g total) by mouth daily.     ROS: Pertinent items noted in HPI and remainder of comprehensive ROS otherwise negative.  Labs/Other Tests and Data Reviewed:    Recent Labs: 03/24/2021: ALT 30; BUN 9; Creatinine, Ser 0.75; Hemoglobin 14.6; Platelets 345.0; Potassium 4.1; Sodium 138; TSH 4.51   Recent Lipid Panel Lab Results  Component Value Date/Time   CHOL 230 (H) 03/24/2021 09:01 AM   CHOL 251 (H) 01/18/2021 09:06 AM   TRIG 157.0 (H) 03/24/2021 09:01  AM   HDL 32.00 (L) 03/24/2021 09:01 AM   HDL 29 (L) 01/18/2021 09:06 AM   CHOLHDL 7 03/24/2021 09:01 AM   LDLCALC 166 (H) 03/24/2021 09:01 AM   LDLCALC 165 (H) 01/18/2021 09:06 AM    Wt Readings from Last 3 Encounters:  05/16/21 136 lb 3.2 oz (61.8 kg)  03/24/21 133 lb (60.3 kg)  01/20/21 137 lb 6.4 oz (62.3 kg)     Exam:    Vital Signs:  Ht 5\' 2"  (1.575 m)   BMI 24.91 kg/m    General appearance: alert and no distress Lungs: No audible wheezing Abdomen: Normal weight Extremities: extremities normal, atraumatic, no cyanosis or edema Skin:  Skin color, texture, turgor normal. No rashes or lesions Neurologic: Grossly normal  ASSESSMENT & PLAN:    Mixed dyslipidemia Low to intermediate cardiovascular risk  Darryl has made some improvements in his triglycerides particularly with dietary changes however LDL-C remains significantly elevated as well as LDL-P.  As previously recommended would advise he start atorvastatin 40 mg daily.  He never started the medicine because he wanted to see what changes could be made with his diet.  He is now agreeable to this and we will plan to repeat lipid NMR in about 3 months.  Follow-up with me at that time.  COVID-19 Education: The signs and symptoms of COVID-19 were discussed with the patient and how to seek care for testing (follow up with PCP or arrange E-visit).  The importance of social distancing was discussed today.  Patient Risk:   After full review of this patients clinical status, I feel that they are at least moderate risk at this time.  Time:   Today, I have spent 15 minutes with the patient with telehealth technology discussing dyslipidemia.     Medication Adjustments/Labs and Tests Ordered: Current medicines are reviewed at length with the patient today.  Concerns regarding medicines are outlined above.   Tests Ordered: No orders of the defined types were placed in this encounter.   Medication Changes: No orders of the defined types were placed in this encounter.   Disposition:  in 3 month(s)  , MD, [facility], FACP  [place]  [facility]  Medical Director of the Advanced Lipid Disorders &  Cardiovascular Risk Reduction Clinic Diplomate of the American Board of Clinical Lipidology Attending Cardiologist  Direct Dial: [phone]  Fax: [phone]  Website:  [email or web address]  [phone], MD  09/15/2021 4:13 PM

## 2021-09-15 NOTE — Patient Instructions (Signed)
Medication Instructions:  START atorvastatin 40mg  daily  *If you need a refill on your cardiac medications before your next appointment, please call your pharmacy*   Lab Work: FASTING lab work to check cholesterol in about 3-4 months -- NMR lipoprofile -- complete 1 week before your next visit  If you have labs (blood work) drawn today and your tests are completely normal, you will receive your results only by: MyChart Message (if you have MyChart) OR A paper copy in the mail If you have any lab test that is abnormal or we need to change your treatment, we will call you to review the results.   Testing/Procedures: NONE   Follow-Up: At [facility], you and your health needs are our priority.  As part of our continuing mission to provide you with exceptional heart care, we have created designated Provider Care Teams.  These Care Teams include your primary Cardiologist (physician) and Advanced Practice Providers (APPs -  Physician Assistants and Nurse Practitioners) who all work together to provide you with the care you need, when you need it.  We recommend signing up for the patient portal called "MyChart".  Sign up information is provided on this After Visit Summary.  MyChart is used to connect with patients for Virtual Visits (Telemedicine).  Patients are able to view lab/test results, encounter notes, upcoming appointments, etc.  Non-urgent messages can be sent to your provider as well.   To learn more about what you can do with MyChart, go to [facility].    Your next appointment:   3-4 months with Dr. [email or web address] -- lipid clinic

## 2021-09-19 NOTE — Progress Notes (Signed)
Established Patient Office Visit  Subjective:  Patient ID: Darryl Jackson, male    DOB: 1976/02/06  Age: 45 y.o. MRN: [id number]  CC:  Chief Complaint  Patient presents with  . Results    Patient states he would like to discuss lab results. Patient has no other concerns.    HPI Darryl Jackson presents for lab results review.  He continues to be very elevated in his lipid panel Lab Results  Component Value Date   CHOL 230 (H) 03/24/2021   HDL 32.00 (L) 03/24/2021   LDLCALC 166 (H) 03/24/2021   TRIG 157.0 (H) 03/24/2021   CHOLHDL 7 03/24/2021    He unfortunately could not tolerate statins.  He is interested in other options.  No past medical history on file.  Past Surgical History:  Procedure Laterality Date  . COLONOSCOPY    . HERNIA REPAIR      Family History  Problem Relation Age of Onset  . Diabetes Mother   . Healthy Father   . Healthy Sister     Social History   Socioeconomic History  . Marital status: Married    Spouse name: Not on file  . Number of children: Not on file  . Years of education: Not on file  . Highest education level: Not on file  Occupational History  . Not on file  Tobacco Use  . Smoking status: Never  . Smokeless tobacco: Never  Vaping Use  . Vaping Use: Never used  Substance and Sexual Activity  . Alcohol use: Never  . Drug use: Never  . Sexual activity: Yes  Other Topics Concern  . Not on file  Social History Narrative  . Not on file   Social Determinants of Health   Financial Resource Strain: Not on file  Food Insecurity: Not on file  Transportation Needs: Not on file  Physical Activity: Not on file  Stress: Not on file  Social Connections: Not on file  Intimate Partner Violence: Not on file    Outpatient Medications Prior to Visit  Medication Sig Dispense Refill  . psyllium (REGULOID) 0.52 g capsule Take 1 capsule (0.52 g total) by mouth daily. 180 capsule 1  . colesevelam (WELCHOL) 625 MG  tablet Take 1 tablet (625 mg total) by mouth 2 (two) times daily with a meal. 180 tablet 3  . omega-3 acid ethyl esters (LOVAZA) 1 g capsule Take 1 capsule (1 g total) by mouth 2 (two) times daily. 180 capsule 1   No facility-administered medications prior to visit.    No Known Allergies  ROS Review of Systems  Constitutional: Negative.   HENT: Negative.    Eyes: Negative.   Respiratory: Negative.    Cardiovascular: Negative.   Gastrointestinal: Negative.   Genitourinary: Negative.   Musculoskeletal: Negative.   Skin: Negative.   Neurological: Negative.   Psychiatric/Behavioral: Negative.    All other systems reviewed and are negative.    Objective:    Physical Exam Constitutional:      General: He is not in acute distress.    Appearance: Normal appearance. He is normal weight. He is not ill-appearing, toxic-appearing or diaphoretic.  Cardiovascular:     Rate and Rhythm: Normal rate and regular rhythm.     Heart sounds: Normal heart sounds. No murmur heard.   No friction rub. No gallop.  Pulmonary:     Effort: Pulmonary effort is normal. No respiratory distress.     Breath sounds: Normal breath sounds. No stridor. No wheezing,  rhonchi or rales.  Chest:     Chest wall: No tenderness.  Neurological:     General: No focal deficit present.     Mental Status: He is alert and oriented to person, place, and time. Mental status is at baseline.  Psychiatric:        Mood and Affect: Mood normal.        Behavior: Behavior normal.        Thought Content: Thought content normal.        Judgment: Judgment normal.    BP 107/68   Pulse 78   Temp 98.6 F (37 C) (Temporal)   Resp 18   Ht _0  (1.549 m)   Wt 137 lb 6.4 oz (62.3 kg)   SpO2 98%   BMI 25.96 kg/m  Wt Readings from Last 3 Encounters:  05/16/21 136 lb 3.2 oz (61.8 kg)  03/24/21 133 lb (60.3 kg)  01/20/21 137 lb 6.4 oz (62.3 kg)     Health Maintenance Due  Topic Date Due  . COVID-19 Vaccine (3 - Booster for  Pfizer series) 04/04/2020  . INFLUENZA VACCINE  06/12/2021    There are no preventive care reminders to display for this patient.  Lab Results  Component Value Date   TSH 4.51 (H) 03/24/2021   Lab Results  Component Value Date   WBC 5.6 03/24/2021   HGB 14.6 03/24/2021   HCT 43.2 03/24/2021   MCV 86.1 03/24/2021   PLT 345.0 03/24/2021   Lab Results  Component Value Date   NA 138 03/24/2021   K 4.1 03/24/2021   CO2 28 03/24/2021   GLUCOSE 84 03/24/2021   BUN 9 03/24/2021   CREATININE 0.75 03/24/2021   BILITOT 0.9 03/24/2021   ALKPHOS 56 03/24/2021   AST 27 03/24/2021   ALT 30 03/24/2021   PROT 7.3 03/24/2021   ALBUMIN 4.5 03/24/2021   CALCIUM 9.6 03/24/2021   EGFR 114 01/18/2021   GFR 108.43 03/24/2021   Lab Results  Component Value Date   CHOL 230 (H) 03/24/2021   Lab Results  Component Value Date   HDL 32.00 (L) 03/24/2021   Lab Results  Component Value Date   LDLCALC 166 (H) 03/24/2021   Lab Results  Component Value Date   TRIG 157.0 (H) 03/24/2021   Lab Results  Component Value Date   CHOLHDL 7 03/24/2021   Lab Results  Component Value Date   HGBA1C 5.1 03/24/2021      Assessment & Plan:   Problem List Items Addressed This Visit   None Visit Diagnoses     Mixed hyperlipidemia           Meds ordered this encounter  Medications  . DISCONTD: colesevelam (WELCHOL) 625 MG tablet    Sig: Take 1 tablet (625 mg total) by mouth daily with breakfast.    Dispense:  180 tablet    Refill:  3    Order Specific Question:   Supervising Provider    Answer:   Carlota Raspberry, JEFFREY R [[id number]]    Follow-up: No follow-ups on file.   PLAN Will try welchol as above. Reviewed risks, benefits, and alternatives. He voices understanding Again reviewed CV risks of elevated lipids. Recheck within 3 mo. Consider referral to lipid d/o clinic if these numbers do not start to fall. Patient encouraged to call clinic with any questions, comments, or  concerns.  Maximiano Coss, NP

## 2021-11-28 ENCOUNTER — Other Ambulatory Visit: Payer: Self-pay

## 2021-11-28 ENCOUNTER — Emergency Department (HOSPITAL_COMMUNITY)
Admission: EM | Admit: 2021-11-28 | Discharge: 2021-11-29 | Payer: No Typology Code available for payment source | Attending: Emergency Medicine | Admitting: Emergency Medicine

## 2021-11-28 DIAGNOSIS — Z20822 Contact with and (suspected) exposure to covid-19: Secondary | ICD-10-CM | POA: Diagnosis not present

## 2021-11-28 DIAGNOSIS — H4901 Third [oculomotor] nerve palsy, right eye: Secondary | ICD-10-CM | POA: Insufficient documentation

## 2021-11-28 DIAGNOSIS — H538 Other visual disturbances: Secondary | ICD-10-CM | POA: Diagnosis present

## 2021-11-28 MED ORDER — LORAZEPAM 1 MG PO TABS: 0.5000 mg | ORAL_TABLET | Freq: Once | ORAL | Status: DC

## 2021-11-28 NOTE — ED Provider Triage Note (Signed)
Emergency Medicine Provider Triage Evaluation Note  Darryl Jackson , a 46 y.o. male  was evaluated in triage.  Pt complains of blurry vision and other vision changes starting several weeks ago.  Patient was seen at the ophthalmologist today, who had concern about cranial nerve III and VI palsies.  They recommended he follow-up in the emergency department today for an urgent MRI brain and orbits with and without contrast.  Review of Systems  Positive: Headache, blurry vision Negative: Vision loss  Physical Exam  BP 126/79 (BP Location: Left Arm)    Pulse 79    Temp 98.4 F (36.9 C) (Oral)    Resp 15    SpO2 99%  Gen:   Awake, no distress   Resp:  Normal effort  MSK:   Moves extremities without difficulty  Other:    Medical Decision Making  Medically screening exam initiated at 8:02 PM.  Appropriate orders placed.  Darryl Jackson was informed that the remainder of the evaluation will be completed by another provider, this initial triage assessment does not replace that evaluation, and the importance of remaining in the ED until their evaluation is complete.  Will obtain imaging at ophthalmology recommendation   Jeanella Flattery 11/28/21 2003

## 2021-11-28 NOTE — ED Notes (Signed)
RN called MRI to see where pt is in que. Stated pt needs IV and to see if claustrophobic. RN started IV and pt denies claustrophobia. RN called MRI to come get pt. Transport request placed.

## 2021-11-28 NOTE — ED Triage Notes (Signed)
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 Pt has paperwork in hand. States that symptoms have occurred intermittently xpast few weeks and ophthalmologist is concerned for acuteness of symptoms.

## 2021-11-29 ENCOUNTER — Emergency Department (HOSPITAL_COMMUNITY): Payer: No Typology Code available for payment source

## 2021-11-29 ENCOUNTER — Encounter (HOSPITAL_COMMUNITY): Payer: Self-pay | Admitting: Emergency Medicine

## 2021-11-29 LAB — I-STAT CHEM 8, ED
BUN: 6 mg/dL (ref 6–20)
Calcium, Ion: 1.14 mmol/L — ABNORMAL LOW (ref 1.15–1.40)
Chloride: 104 mmol/L (ref 98–111)
Creatinine, Ser: 0.6 mg/dL — ABNORMAL LOW (ref 0.61–1.24)
Glucose, Bld: 104 mg/dL — ABNORMAL HIGH (ref 70–99)
HCT: 44 % (ref 39.0–52.0)
Hemoglobin: 15 g/dL (ref 13.0–17.0)
Potassium: 4.8 mmol/L (ref 3.5–5.1)
Sodium: 139 mmol/L (ref 135–145)
TCO2: 28 mmol/L (ref 22–32)

## 2021-11-29 LAB — CBC WITH DIFFERENTIAL/PLATELET
Abs Immature Granulocytes: 0.02 10*3/uL (ref 0.00–0.07)
Basophils Absolute: 0.1 10*3/uL (ref 0.0–0.1)
Basophils Relative: 1 %
Eosinophils Absolute: 0.2 10*3/uL (ref 0.0–0.5)
Eosinophils Relative: 3 %
HCT: 43.5 % (ref 39.0–52.0)
Hemoglobin: 15.1 g/dL (ref 13.0–17.0)
Immature Granulocytes: 0 %
Lymphocytes Relative: 26 %
Lymphs Abs: 1.7 10*3/uL (ref 0.7–4.0)
MCH: 29.5 pg (ref 26.0–34.0)
MCHC: 34.7 g/dL (ref 30.0–36.0)
MCV: 85 fL (ref 80.0–100.0)
Monocytes Absolute: 0.7 10*3/uL (ref 0.1–1.0)
Monocytes Relative: 11 %
Neutro Abs: 3.9 10*3/uL (ref 1.7–7.7)
Neutrophils Relative %: 59 %
Platelets: 280 10*3/uL (ref 150–400)
RBC: 5.12 MIL/uL (ref 4.22–5.81)
RDW: 12.5 % (ref 11.5–15.5)
WBC: 6.7 10*3/uL (ref 4.0–10.5)
nRBC: 0 % (ref 0.0–0.2)

## 2021-11-29 LAB — RESP PANEL BY RT-PCR (FLU A&B, COVID) ARPGX2
Influenza A by PCR: NEGATIVE
Influenza B by PCR: NEGATIVE
SARS Coronavirus 2 by RT PCR: NEGATIVE

## 2021-11-29 MED ORDER — GADOBUTROL 1 MMOL/ML IV SOLN
6.0000 mL | Freq: Once | INTRAVENOUS | Status: AC | PRN
  Administered 2021-11-29: 6 mL via INTRAVENOUS

## 2021-11-29 NOTE — Discharge Instructions (Signed)
Follow-up with your ophthalmologist.

## 2021-11-29 NOTE — ED Provider Notes (Signed)
[facility] EMERGENCY DEPARTMENT Provider Note   CSN: [id number] Arrival date & time: 11/28/21  1816     History  Chief Complaint  Patient presents with   Eye Problem    Darryl Jackson is a 46 y.o. male.  The history is provided by the patient.  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 Authorizing Provider  acetic acid-hydrocortisone (VOSOL-HC) OTIC solution Place 3 drops into both ears 3 (three) times daily. 03/24/21   Maximiano Coss, NP  atorvastatin (LIPITOR) 40 MG tablet Take 1 tablet (40 mg total) by mouth daily. 09/15/21 12/14/21  Pixie Casino, MD  cetirizine (ZYRTEC) 10 MG tablet Take 1 tablet (10 mg total) by mouth daily. 03/24/21   Maximiano Coss, NP  diclofenac (VOLTAREN) 75 MG EC tablet Take 1 tablet (75 mg total) by mouth 2 (two) times daily. 03/24/21   Maximiano Coss, NP  psyllium (REGULOID) 0.52 g capsule Take 1 capsule (0.52 g total) by mouth daily. 11/16/20   Maximiano Coss, NP      Allergies    Patient has no known allergies.    Review of Systems   Review of Systems  Constitutional:  Negative for fever.  HENT:  Negative for congestion and facial swelling.   Eyes:  Positive for visual disturbance. Negative  for blurred vision, photophobia, pain, discharge, redness and itching.  Respiratory:  Negative for shortness of breath.   Cardiovascular:  Negative for chest pain.  Gastrointestinal:  Negative for nausea and vomiting.  Musculoskeletal:  Negative for neck stiffness.  Skin:  Negative for rash.  Neurological:  Negative for dizziness, facial asymmetry, speech difficulty, weakness, numbness and headaches.  All other systems reviewed and are negative.  Physical Exam Updated Vital Signs BP (!) 126/94    Pulse 90    Temp 98.4 F (36.9 C) (Oral)    Resp 16    SpO2 100%  Physical Exam Vitals and nursing note reviewed.  Constitutional:      General: He is not in acute distress.    Appearance: Normal appearance.  HENT:     Head: Normocephalic and atraumatic.     Nose: Nose normal.  Eyes:     Conjunctiva/sclera: Conjunctivae normal.     Pupils: Pupils are equal, round, and reactive to light.     Comments: Cn3 palsy noted R eye   Cardiovascular:     Rate and Rhythm: Normal rate and regular rhythm.     Pulses: Normal pulses.     Heart sounds: Normal heart sounds.  Pulmonary:     Effort: Pulmonary effort is normal.     Breath sounds: Normal breath sounds.  Abdominal:     General: Abdomen is flat. Bowel sounds are normal.     Palpations: Abdomen is soft.  Tenderness: There is no abdominal tenderness.  Musculoskeletal:        General: Normal range of motion.     Cervical back: Normal range of motion and neck supple.  Skin:    General: Skin is warm and dry.  Neurological:     Mental Status: He is alert and oriented to person, place, and time.     Sensory: No sensory deficit.     Motor: No weakness.     Gait: Gait normal.     Deep Tendon Reflexes: Reflexes normal.  Psychiatric:        Mood and Affect: Mood normal.    ED Results / Procedures / Treatments   Labs (all labs ordered are listed, but only abnormal results are displayed) Results for orders placed or performed during the  hospital encounter of 11/28/21  Resp Panel by RT-PCR (Flu A&B, Covid) Nasopharyngeal Swab   Specimen: Nasopharyngeal Swab; Nasopharyngeal(NP) swabs in vial transport medium  Result Value Ref Range   SARS Coronavirus 2 by RT PCR NEGATIVE NEGATIVE   Influenza A by PCR NEGATIVE NEGATIVE   Influenza B by PCR NEGATIVE NEGATIVE  CBC with Differential/Platelet  Result Value Ref Range   WBC 6.7 4.0 - 10.5 K/uL   RBC 5.12 4.22 - 5.81 MIL/uL   Hemoglobin 15.1 13.0 - 17.0 g/dL   HCT 43.5 39.0 - 52.0 %   MCV 85.0 80.0 - 100.0 fL   MCH 29.5 26.0 - 34.0 pg   MCHC 34.7 30.0 - 36.0 g/dL   RDW 12.5 11.5 - 15.5 %   Platelets 280 150 - 400 K/uL   nRBC 0.0 0.0 - 0.2 %   Neutrophils Relative % 59 %   Neutro Abs 3.9 1.7 - 7.7 K/uL   Lymphocytes Relative 26 %   Lymphs Abs 1.7 0.7 - 4.0 K/uL   Monocytes Relative 11 %   Monocytes Absolute 0.7 0.1 - 1.0 K/uL   Eosinophils Relative 3 %   Eosinophils Absolute 0.2 0.0 - 0.5 K/uL   Basophils Relative 1 %   Basophils Absolute 0.1 0.0 - 0.1 K/uL   Immature Granulocytes 0 %   Abs Immature Granulocytes 0.02 0.00 - 0.07 K/uL  I-stat chem 8, ED (not at [facility])  Result Value Ref Range   Sodium 139 135 - 145 mmol/L   Potassium 4.8 3.5 - 5.1 mmol/L   Chloride 104 98 - 111 mmol/L   BUN 6 6 - 20 mg/dL   Creatinine, Ser 0.60 (L) 0.61 - 1.24 mg/dL   Glucose, Bld 104 (H) 70 - 99 mg/dL   Calcium, Ion 1.14 (L) 1.15 - 1.40 mmol/L   TCO2 28 22 - 32 mmol/L   Hemoglobin 15.0 13.0 - 17.0 g/dL   HCT 44.0 39.0 - 52.0 %   MR BRAIN W WO CONTRAST  Result Date: 11/29/2021 CLINICAL DATA:  Initial evaluation for acute blurry vision, possible cranial nerve palsy. EXAM: MRI HEAD AND ORBITS WITHOUT AND WITH CONTRAST TECHNIQUE: Multiplanar, multiecho pulse sequences of the brain and surrounding structures were obtained without and with intravenous contrast. Multiplanar, multiecho pulse sequences of the orbits and surrounding structures were obtained including fat saturation  techniques, before and after intravenous contrast administration. CONTRAST:  28mL GADAVIST GADOBUTROL 1 MMOL/ML IV SOLN COMPARISON:  None available. FINDINGS: MRI HEAD FINDINGS Brain: Cerebral volume within normal limits. Few scattered subcentimeter foci of T2/FLAIR hyperintensity noted involving the supratentorial cerebral white matter, nonspecific, but overall minimal in nature, and felt to be within normal  limits for age. No evidence for acute or subacute infarct. Gray-white matter differentiation maintained. No encephalomalacia to suggest chronic cortical infarction. No evidence for acute or chronic intracranial hemorrhage. No mass lesion, midline shift or mass effect. No hydrocephalus or extra-axial fluid collection. Pituitary gland suprasellar region normal. Midline structures intact. No abnormal enhancement. Vascular: Major intracranial vascular flow voids are maintained. Skull and upper cervical spine: Craniocervical junction within normal limits. Bone marrow signal intensity normal. No scalp soft tissue abnormality. Other: Inner ear structures grossly normal. Mastoid air cells are largely clear. MRI ORBITS FINDINGS Orbits: Globes are symmetric in size with normal appearance and morphology bilaterally. Optic nerves symmetric and within normal limits. No intrinsic optic nerve edema or enhancement to suggest optic neuritis. No abnormality seen along either optic nerve sheath. Intraconal and extraconal fat well-maintained. Extra-ocular muscles symmetric and within normal limits. Lacrimal glands normal. No abnormality about the orbital apices. Optic chiasm normally situated within the suprasellar cistern. No abnormality seen about the visualized optic radiations. No visible abnormality about the cavernous sinus. Superior orbital veins fairly symmetric and within normal limits. Visualized sinuses: Mild mucosal thickening within the ethmoidal air cells and maxillary sinuses. Visualized paranasal sinuses are  otherwise clear. Soft tissues: Unremarkable. IMPRESSION: Normal MRI of the brain and orbits. No findings to explain patient's symptoms identified. Electronically Signed   By: Jeannine Boga M.D.   On: 11/29/2021 04:12   MR ORBITS W WO CONTRAST  Result Date: 11/29/2021 CLINICAL DATA:  Initial evaluation for acute blurry vision, possible cranial nerve palsy. EXAM: MRI HEAD AND ORBITS WITHOUT AND WITH CONTRAST TECHNIQUE: Multiplanar, multiecho pulse sequences of the brain and surrounding structures were obtained without and with intravenous contrast. Multiplanar, multiecho pulse sequences of the orbits and surrounding structures were obtained including fat saturation techniques, before and after intravenous contrast administration. CONTRAST:  49mL GADAVIST GADOBUTROL 1 MMOL/ML IV SOLN COMPARISON:  None available. FINDINGS: MRI HEAD FINDINGS Brain: Cerebral volume within normal limits. Few scattered subcentimeter foci of T2/FLAIR hyperintensity noted involving the supratentorial cerebral white matter, nonspecific, but overall minimal in nature, and felt to be within normal limits for age. No evidence for acute or subacute infarct. Gray-white matter differentiation maintained. No encephalomalacia to suggest chronic cortical infarction. No evidence for acute or chronic intracranial hemorrhage. No mass lesion, midline shift or mass effect. No hydrocephalus or extra-axial fluid collection. Pituitary gland suprasellar region normal. Midline structures intact. No abnormal enhancement. Vascular: Major intracranial vascular flow voids are maintained. Skull and upper cervical spine: Craniocervical junction within normal limits. Bone marrow signal intensity normal. No scalp soft tissue abnormality. Other: Inner ear structures grossly normal. Mastoid air cells are largely clear. MRI ORBITS FINDINGS Orbits: Globes are symmetric in size with normal appearance and morphology bilaterally. Optic nerves symmetric and within  normal limits. No intrinsic optic nerve edema or enhancement to suggest optic neuritis. No abnormality seen along either optic nerve sheath. Intraconal and extraconal fat well-maintained. Extra-ocular muscles symmetric and within normal limits. Lacrimal glands normal. No abnormality about the orbital apices. Optic chiasm normally situated within the suprasellar cistern. No abnormality seen about the visualized optic radiations. No visible abnormality about the cavernous sinus. Superior orbital veins fairly symmetric and within normal limits. Visualized sinuses: Mild mucosal thickening within the ethmoidal air cells and maxillary sinuses. Visualized paranasal sinuses are otherwise clear. Soft tissues: Unremarkable. IMPRESSION: Normal MRI of the brain and orbits. No findings to explain patient's symptoms identified. Electronically Signed   By: Jeannine Boga M.D.   On:  11/29/2021 04:12     Radiology MR BRAIN W WO CONTRAST  Result Date: 11/29/2021 CLINICAL DATA:  Initial evaluation for acute blurry vision, possible cranial nerve palsy. EXAM: MRI HEAD AND ORBITS WITHOUT AND WITH CONTRAST TECHNIQUE: Multiplanar, multiecho pulse sequences of the brain and surrounding structures were obtained without and with intravenous contrast. Multiplanar, multiecho pulse sequences of the orbits and surrounding structures were obtained including fat saturation techniques, before and after intravenous contrast administration. CONTRAST:  43mL GADAVIST GADOBUTROL 1 MMOL/ML IV SOLN COMPARISON:  None available. FINDINGS: MRI HEAD FINDINGS Brain: Cerebral volume within normal limits. Few scattered subcentimeter foci of T2/FLAIR hyperintensity noted involving the supratentorial cerebral white matter, nonspecific, but overall minimal in nature, and felt to be within normal limits for age. No evidence for acute or subacute infarct. Gray-white matter differentiation maintained. No encephalomalacia to suggest chronic cortical  infarction. No evidence for acute or chronic intracranial hemorrhage. No mass lesion, midline shift or mass effect. No hydrocephalus or extra-axial fluid collection. Pituitary gland suprasellar region normal. Midline structures intact. No abnormal enhancement. Vascular: Major intracranial vascular flow voids are maintained. Skull and upper cervical spine: Craniocervical junction within normal limits. Bone marrow signal intensity normal. No scalp soft tissue abnormality. Other: Inner ear structures grossly normal. Mastoid air cells are largely clear. MRI ORBITS FINDINGS Orbits: Globes are symmetric in size with normal appearance and morphology bilaterally. Optic nerves symmetric and within normal limits. No intrinsic optic nerve edema or enhancement to suggest optic neuritis. No abnormality seen along either optic nerve sheath. Intraconal and extraconal fat well-maintained. Extra-ocular muscles symmetric and within normal limits. Lacrimal glands normal. No abnormality about the orbital apices. Optic chiasm normally situated within the suprasellar cistern. No abnormality seen about the visualized optic radiations. No visible abnormality about the cavernous sinus. Superior orbital veins fairly symmetric and within normal limits. Visualized sinuses: Mild mucosal thickening within the ethmoidal air cells and maxillary sinuses. Visualized paranasal sinuses are otherwise clear. Soft tissues: Unremarkable. IMPRESSION: Normal MRI of the brain and orbits. No findings to explain patient's symptoms identified. Electronically Signed   By: Jeannine Boga M.D.   On: 11/29/2021 04:12   MR ORBITS W WO CONTRAST  Result Date: 11/29/2021 CLINICAL DATA:  Initial evaluation for acute blurry vision, possible cranial nerve palsy. EXAM: MRI HEAD AND ORBITS WITHOUT AND WITH CONTRAST TECHNIQUE: Multiplanar, multiecho pulse sequences of the brain and surrounding structures were obtained without and with intravenous contrast.  Multiplanar, multiecho pulse sequences of the orbits and surrounding structures were obtained including fat saturation techniques, before and after intravenous contrast administration. CONTRAST:  40mL GADAVIST GADOBUTROL 1 MMOL/ML IV SOLN COMPARISON:  None available. FINDINGS: MRI HEAD FINDINGS Brain: Cerebral volume within normal limits. Few scattered subcentimeter foci of T2/FLAIR hyperintensity noted involving the supratentorial cerebral white matter, nonspecific, but overall minimal in nature, and felt to be within normal limits for age. No evidence for acute or subacute infarct. Gray-white matter differentiation maintained. No encephalomalacia to suggest chronic cortical infarction. No evidence for acute or chronic intracranial hemorrhage. No mass lesion, midline shift or mass effect. No hydrocephalus or extra-axial fluid collection. Pituitary gland suprasellar region normal. Midline structures intact. No abnormal enhancement. Vascular: Major intracranial vascular flow voids are maintained. Skull and upper cervical spine: Craniocervical junction within normal limits. Bone marrow signal intensity normal. No scalp soft tissue abnormality. Other: Inner ear structures grossly normal. Mastoid air cells are largely clear. MRI ORBITS FINDINGS Orbits: Globes are symmetric in size with normal appearance and morphology bilaterally. Optic  nerves symmetric and within normal limits. No intrinsic optic nerve edema or enhancement to suggest optic neuritis. No abnormality seen along either optic nerve sheath. Intraconal and extraconal fat well-maintained. Extra-ocular muscles symmetric and within normal limits. Lacrimal glands normal. No abnormality about the orbital apices. Optic chiasm normally situated within the suprasellar cistern. No abnormality seen about the visualized optic radiations. No visible abnormality about the cavernous sinus. Superior orbital veins fairly symmetric and within normal limits. Visualized sinuses:  Mild mucosal thickening within the ethmoidal air cells and maxillary sinuses. Visualized paranasal sinuses are otherwise clear. Soft tissues: Unremarkable. IMPRESSION: Normal MRI of the brain and orbits. No findings to explain patient's symptoms identified. Electronically Signed   By: Jeannine Boga M.D.   On: 11/29/2021 04:12    Procedures Procedures    Medications Ordered in ED Medications  LORazepam (ATIVAN) tablet 0.5 mg (has no administration in time range)  gadobutrol (GADAVIST) 1 MMOL/ML injection 6 mL (6 mLs Intravenous Contrast Given 11/29/21 0228)    ED Course/ Medical Decision Making/ A&P                           Medical Decision Making Patient sent in for over a month os stable cranial nerve palsy to get MRI by Ophtho.  MRIs are normal and there are no other symptoms.    Amount and/or Complexity of Data Reviewed External Data Reviewed: notes.    Details: reviewed ophthalmology notes Labs: ordered. Decision-making details documented in ED Course.    Details: normal labs in the ED Radiology: ordered and independent interpretation performed. Decision-making details documented in ED Course.    Details: normal Discussion of management or test interpretation with external provider(s): Discussed at 345 am with Dr. Leonel Ramsay of neurology.  Will need to follow up with ophthalmology for ongoing care as well as PMD.  Nothing acutely for neurology to do at this time      Patient referred back to PMD and ophthalmology for ongoing care as no stroke nor mass occupying lesions.  No diabetes.    Darryl Jackson was evaluated in Emergency Department on 11/29/2021 for the symptoms described in the history of present illness. He was evaluated in the context of the global COVID-19 pandemic, which necessitated consideration that the patient might be at risk for infection with the SARS-CoV-2 virus that causes COVID-19. Institutional protocols and algorithms that pertain to the  evaluation of patients at risk for COVID-19 are in a state of rapid change based on information released by regulatory bodies including the CDC and federal and state organizations. These policies and algorithms were followed during the patient's care in the ED.  Final Clinical Impression(s) / ED Diagnoses Final diagnoses:  None   Return for intractable cough, coughing up blood, fevers > 100.4 unrelieved by medication, shortness of breath, intractable vomiting, chest pain, shortness of breath, weakness, numbness, changes in speech, facial asymmetry, abdominal pain, passing out, Inability to tolerate liquids or food, cough, altered mental status or any concerns. No signs of systemic illness or infection. The patient is nontoxic-appearing on exam and vital signs are within normal limits.  I have reviewed the triage vital signs and the nursing notes. Pertinent labs & imaging results that were available during my care of the patient were reviewed by me and considered in my medical decision making (see chart for details). After history, exam, and medical workup I feel the patient has been appropriately medically screened and is safe  for discharge home. Pertinent diagnoses were discussed with the patient. Patient was given return precautions.      Rx / DC Orders ED Discharge Orders     None         Renardo Cheatum, MD 11/29/21 [phone]

## 2021-11-29 NOTE — ED Notes (Signed)
Pt in MRI will obtain vitals when pt returns.

## 2021-11-29 NOTE — ED Notes (Signed)
Patient verbalizes understanding of discharge instructions. Opportunity for questioning and answers were provided. Armband removed by staff, pt discharged from ED and ambulated to lobby to return home.   

## 2021-12-26 ENCOUNTER — Ambulatory Visit (INDEPENDENT_AMBULATORY_CARE_PROVIDER_SITE_OTHER): Payer: No Typology Code available for payment source | Admitting: Registered Nurse

## 2021-12-26 ENCOUNTER — Encounter: Payer: Self-pay | Admitting: Registered Nurse

## 2021-12-26 ENCOUNTER — Other Ambulatory Visit: Payer: Self-pay

## 2021-12-26 VITALS — BP 103/69 | HR 82 | Temp 98.1°F | Resp 18 | Ht 62.0 in | Wt 136.8 lb

## 2021-12-26 DIAGNOSIS — Z23 Encounter for immunization: Secondary | ICD-10-CM

## 2021-12-26 DIAGNOSIS — H53131 Sudden visual loss, right eye: Secondary | ICD-10-CM

## 2021-12-26 MED ORDER — B COMPLEX-C PO TABS: 1.0000 | ORAL_TABLET | Freq: Every day | ORAL | 3 refills | Status: AC

## 2021-12-26 MED ORDER — GABAPENTIN 100 MG PO CAPS: 100.0000 mg | ORAL_CAPSULE | Freq: Three times a day (TID) | ORAL | 3 refills | Status: DC

## 2021-12-26 NOTE — Progress Notes (Signed)
Established Patient Office Visit  Subjective:  Patient ID: Darryl Jackson, male    DOB: Nov 06, 1976  Age: 46 y.o. MRN: [id number]  CC:  Chief Complaint  Patient presents with   Results    Patient states he would like to discuss results     HPI Darryl Ramkumar Saraceno presents for ER follow up  Presented with acute visual change in R eye to optometry Blurred vision Seen by [facility]  Has been referred to [facility], seen there, referred to ED  Unremarkable work up. MRI head and orbits wnl.   Has been referred to neuroophthalmology but has not yet heard from them.  Notes 2 days ago , notes L side of head pain. Throbbing. No rash. Has not treated. No visual change on L side.   No weakness in extremities. No GI symptoms. No facial drooping.   History reviewed. No pertinent past medical history.  Past Surgical History:  Procedure Laterality Date   COLONOSCOPY     HERNIA REPAIR      Family History  Problem Relation Age of Onset   Diabetes Mother    Healthy Father    Healthy Sister     Social History   Socioeconomic History   Marital status: Married    Spouse name: Not on file   Number of children: Not on file   Years of education: Not on file   Highest education level: Not on file  Occupational History   Not on file  Tobacco Use   Smoking status: Never   Smokeless tobacco: Never  Vaping Use   Vaping Use: Never used  Substance and Sexual Activity   Alcohol use: Never   Drug use: Never   Sexual activity: Yes  Other Topics Concern   Not on file  Social History Narrative   Not on file   Social Determinants of Health   Financial Resource Strain: Not on file  Food Insecurity: Not on file  Transportation Needs: Not on file  Physical Activity: Not on file  Stress: Not on file  Social Connections: Not on file  Intimate Partner Violence: Not on file    Outpatient Medications Prior to Visit  Medication Sig Dispense Refill    acetic acid-hydrocortisone (VOSOL-HC) OTIC solution Place 3 drops into both ears 3 (three) times daily. 10 mL 0   cetirizine (ZYRTEC) 10 MG tablet Take 1 tablet (10 mg total) by mouth daily. 30 tablet 11   diclofenac (VOLTAREN) 75 MG EC tablet Take 1 tablet (75 mg total) by mouth 2 (two) times daily. 30 tablet 0   psyllium (REGULOID) 0.52 g capsule Take 1 capsule (0.52 g total) by mouth daily. 180 capsule 1   atorvastatin (LIPITOR) 40 MG tablet Take 1 tablet (40 mg total) by mouth daily. 90 tablet 3   No facility-administered medications prior to visit.    No Known Allergies  ROS Review of Systems  Constitutional: Negative.   HENT: Negative.    Eyes: Negative.   Respiratory: Negative.    Cardiovascular: Negative.   Gastrointestinal: Negative.   Genitourinary: Negative.   Musculoskeletal: Negative.   Skin: Negative.   Neurological: Negative.   Psychiatric/Behavioral: Negative.    All other systems reviewed and are negative.    Objective:    Physical Exam Constitutional:      General: He is not in acute distress.    Appearance: Normal appearance. He is normal weight. He is not ill-appearing, toxic-appearing or diaphoretic.  Cardiovascular:  Rate and Rhythm: Normal rate and regular rhythm.     Heart sounds: Normal heart sounds. No murmur heard.   No friction rub. No gallop.  Pulmonary:     Effort: Pulmonary effort is normal. No respiratory distress.     Breath sounds: Normal breath sounds. No stridor. No wheezing, rhonchi or rales.  Chest:     Chest wall: No tenderness.  Neurological:     General: No focal deficit present.     Mental Status: He is alert and oriented to person, place, and time. Mental status is at baseline.  Psychiatric:        Mood and Affect: Mood normal.        Behavior: Behavior normal.        Thought Content: Thought content normal.        Judgment: Judgment normal.    BP 103/69    Pulse 82    Temp 98.1 F (36.7 C) (Temporal)    Resp 18    Ht  5' 2"  (1.575 m)    Wt 136 lb 12.8 oz (62.1 kg)    SpO2 94%    BMI 25.02 kg/m  Wt Readings from Last 3 Encounters:  12/26/21 136 lb 12.8 oz (62.1 kg)  05/16/21 136 lb 3.2 oz (61.8 kg)  03/24/21 133 lb (60.3 kg)     Health Maintenance Due  Topic Date Due   COVID-19 Vaccine (3 - Booster for Pfizer series) 04/04/2020   INFLUENZA VACCINE  06/12/2021    There are no preventive care reminders to display for this patient.  Lab Results  Component Value Date   TSH 3.37 12/26/2021   Lab Results  Component Value Date   WBC 5.5 12/26/2021   HGB 13.4 12/26/2021   HCT 39.7 12/26/2021   MCV 85.3 12/26/2021   PLT 257.0 12/26/2021   Lab Results  Component Value Date   NA 139 12/26/2021   K 4.2 12/26/2021   CO2 27 12/26/2021   GLUCOSE 86 12/26/2021   BUN 8 12/26/2021   CREATININE 0.69 12/26/2021   BILITOT 0.7 12/26/2021   ALKPHOS 56 03/24/2021   AST 26 12/26/2021   ALT 34 12/26/2021   PROT 6.6 12/26/2021   ALBUMIN 4.5 03/24/2021   CALCIUM 9.3 12/26/2021   EGFR 114 01/18/2021   GFR 108.43 03/24/2021   Lab Results  Component Value Date   CHOL 230 (H) 03/24/2021   Lab Results  Component Value Date   HDL 32.00 (L) 03/24/2021   Lab Results  Component Value Date   LDLCALC 166 (H) 03/24/2021   Lab Results  Component Value Date   TRIG 157.0 (H) 03/24/2021   Lab Results  Component Value Date   CHOLHDL 7 03/24/2021   Lab Results  Component Value Date   HGBA1C 5.1 03/24/2021      Assessment & Plan:   Problem List Items Addressed This Visit   None Visit Diagnoses     Flu vaccine need    -  Primary   Relevant Orders   Flu Vaccine QUAD 6+ mos PF IM (Fluarix Quad PF)   Sudden visual loss of right eye       Relevant Medications   gabapentin (NEURONTIN) 100 MG capsule   B Complex-C (B-COMPLEX WITH VITAMIN C) tablet   Other Relevant Orders   ANA, IFA Comprehensive Panel-([facility])   CBC with Differential/Platelet (Completed)   TSH (Completed)   B12 and Folate Panel  (Completed)   Vitamin D (25 hydroxy) (Completed)  Comprehensive metabolic panel (Completed)       Meds ordered this encounter  Medications   gabapentin (NEURONTIN) 100 MG capsule    Sig: Take 1 capsule (100 mg total) by mouth 3 (three) times daily.    Dispense:  90 capsule    Refill:  3    Order Specific Question:   Supervising Provider    Answer:   Carlota Raspberry, JEFFREY R [[id number]]   B Complex-C (B-COMPLEX WITH VITAMIN C) tablet    Sig: Take 1 tablet by mouth daily.    Dispense:  90 tablet    Refill:  3    Order Specific Question:   Supervising Provider    Answer:   Carlota Raspberry, JEFFREY R [[id number]]    Follow-up: Return if symptoms worsen or fail to improve.   PLAN Gabapentin for pain B complex vitamin Check labs. He will continue to follow with neuro ophth Patient encouraged to call clinic with any questions, comments, or concerns.  Maximiano Coss, NP

## 2021-12-26 NOTE — Patient Instructions (Addendum)
Mr. Leon -   Randie Heinz to see you  Let's check on labs today I will make sure you are connected with a neuro-ophthalmologist.  I will sent Vitamin B complex for you to take once daily  I will send gabapentin for you to take three times daily for nerves and nerve pain  I will let you know how results look and we can adjust the plan if we need to  Thank you  Rich     If you have lab work done today you will be contacted with your lab results within the next 2 weeks.  If you have not heard from [place] then please contact us. The fastest way to get your results is to register for My Chart.   IF you received an x-ray today, you will receive an invoice from [facility]. Please contact [facility] at [phone] with questions or concerns regarding your invoice.   IF you received labwork today, you will receive an invoice from [facility]. Please contact [facility] at [phone] with questions or concerns regarding your invoice.   Our billing staff will not be able to assist you with questions regarding bills from these companies.  You will be contacted with the lab results as soon as they are available. The fastest way to get your results is to activate your My Chart account. Instructions are located on the last page of this paperwork. If you have not heard from [place] regarding the results in 2 weeks, please contact this office.

## 2021-12-27 ENCOUNTER — Other Ambulatory Visit: Payer: Self-pay | Admitting: Registered Nurse

## 2021-12-27 ENCOUNTER — Encounter: Payer: Self-pay | Admitting: Registered Nurse

## 2021-12-27 DIAGNOSIS — E559 Vitamin D deficiency, unspecified: Secondary | ICD-10-CM

## 2021-12-27 LAB — CBC WITH DIFFERENTIAL/PLATELET
Basophils Absolute: 0.1 10*3/uL (ref 0.0–0.1)
Basophils Relative: 1.4 % (ref 0.0–3.0)
Eosinophils Absolute: 0.2 10*3/uL (ref 0.0–0.7)
Eosinophils Relative: 4.1 % (ref 0.0–5.0)
HCT: 39.7 % (ref 39.0–52.0)
Hemoglobin: 13.4 g/dL (ref 13.0–17.0)
Lymphocytes Relative: 26.7 % (ref 12.0–46.0)
Lymphs Abs: 1.5 10*3/uL (ref 0.7–4.0)
MCHC: 33.8 g/dL (ref 30.0–36.0)
MCV: 85.3 fl (ref 78.0–100.0)
Monocytes Absolute: 0.7 10*3/uL (ref 0.1–1.0)
Monocytes Relative: 12.9 % — ABNORMAL HIGH (ref 3.0–12.0)
Neutro Abs: 3 10*3/uL (ref 1.4–7.7)
Neutrophils Relative %: 54.9 % (ref 43.0–77.0)
Platelets: 257 10*3/uL (ref 150.0–400.0)
RBC: 4.66 Mil/uL (ref 4.22–5.81)
RDW: 13.5 % (ref 11.5–15.5)
WBC: 5.5 10*3/uL (ref 4.0–10.5)

## 2021-12-27 LAB — TSH: TSH: 3.37 u[IU]/mL (ref 0.35–5.50)

## 2021-12-27 LAB — B12 AND FOLATE PANEL
Folate: 8.4 ng/mL (ref >5.9)
Vitamin B-12: 113 pg/mL — ABNORMAL LOW (ref 211–911)

## 2021-12-27 LAB — VITAMIN D 25 HYDROXY (VIT D DEFICIENCY, FRACTURES): VITD: 7.22 ng/mL — ABNORMAL LOW (ref 30.00–100.00)

## 2021-12-27 MED ORDER — VITAMIN D (ERGOCALCIFEROL) 1.25 MG (50000 UNIT) PO CAPS: 50000.0000 [IU] | ORAL_CAPSULE | ORAL | 0 refills | Status: DC

## 2021-12-28 LAB — COMPREHENSIVE METABOLIC PANEL
AG Ratio: 1.9 (calc) (ref 1.0–2.5)
ALT: 34 U/L (ref 9–46)
AST: 26 U/L (ref 10–40)
Albumin: 4.3 g/dL (ref 3.6–5.1)
Alkaline phosphatase (APISO): 56 U/L (ref 36–130)
BUN: 8 mg/dL (ref 7–25)
CO2: 27 mmol/L (ref 20–32)
Calcium: 9.3 mg/dL (ref 8.6–10.3)
Chloride: 104 mmol/L (ref 98–110)
Creat: 0.69 mg/dL (ref 0.60–1.29)
Globulin: 2.3 g/dL (calc) (ref 1.9–3.7)
Glucose, Bld: 86 mg/dL (ref 65–99)
Potassium: 4.2 mmol/L (ref 3.5–5.3)
Sodium: 139 mmol/L (ref 135–146)
Total Bilirubin: 0.7 mg/dL (ref 0.2–1.2)
Total Protein: 6.6 g/dL (ref 6.1–8.1)

## 2021-12-28 LAB — ANA, IFA COMPREHENSIVE PANEL
Anti Nuclear Antibody (ANA): POSITIVE — AB
ENA SM Ab Ser-aCnc: <1 AI
SM/RNP: <1 AI
SSA (Ro) (ENA) Antibody, IgG: <1 AI
SSB (La) (ENA) Antibody, IgG: <1 AI
Scleroderma (Scl-70) (ENA) Antibody, IgG: <1 AI
ds DNA Ab: <1 IU/mL

## 2021-12-28 LAB — ANTI-NUCLEAR AB-TITER (ANA TITER): ANA Titer 1: 1:40 {titer} — ABNORMAL HIGH

## 2022-01-31 ENCOUNTER — Other Ambulatory Visit: Payer: Self-pay | Admitting: Registered Nurse

## 2022-01-31 ENCOUNTER — Other Ambulatory Visit (INDEPENDENT_AMBULATORY_CARE_PROVIDER_SITE_OTHER): Payer: No Typology Code available for payment source

## 2022-01-31 DIAGNOSIS — E559 Vitamin D deficiency, unspecified: Secondary | ICD-10-CM

## 2022-01-31 DIAGNOSIS — E782 Mixed hyperlipidemia: Secondary | ICD-10-CM

## 2022-01-31 LAB — VITAMIN D 25 HYDROXY (VIT D DEFICIENCY, FRACTURES): VITD: 15.3 ng/mL — ABNORMAL LOW (ref 30.00–100.00)

## 2022-01-31 LAB — HEMOGLOBIN A1C: Hgb A1c MFr Bld: 5.3 % (ref 4.6–6.5)

## 2022-02-01 ENCOUNTER — Other Ambulatory Visit: Payer: Self-pay | Admitting: Registered Nurse

## 2022-02-01 DIAGNOSIS — E559 Vitamin D deficiency, unspecified: Secondary | ICD-10-CM

## 2022-02-01 MED ORDER — VITAMIN D (ERGOCALCIFEROL) 1.25 MG (50000 UNIT) PO CAPS: 50000.0000 [IU] | ORAL_CAPSULE | ORAL | 0 refills | Status: DC

## 2022-02-06 LAB — NMR, LIPOPROFILE
Cholesterol, Total: 134 mg/dL (ref 100–199)
HDL Particle Number: 27 umol/L — ABNORMAL LOW (ref >=30.5)
HDL-C: 32 mg/dL — ABNORMAL LOW (ref >39)
LDL Particle Number: 965 nmol/L (ref <1000)
LDL Size: 19.6 nm — ABNORMAL LOW (ref >20.5)
LDL-C (NIH Calc): 64 mg/dL (ref 0–99)
LP-IR Score: 67 — ABNORMAL HIGH (ref <=45)
Small LDL Particle Number: 747 nmol/L — ABNORMAL HIGH (ref <=527)
Triglycerides: 236 mg/dL — ABNORMAL HIGH (ref 0–149)

## 2022-02-08 ENCOUNTER — Ambulatory Visit (INDEPENDENT_AMBULATORY_CARE_PROVIDER_SITE_OTHER): Payer: No Typology Code available for payment source | Admitting: Internal Medicine

## 2022-02-08 ENCOUNTER — Encounter: Payer: Self-pay | Admitting: Internal Medicine

## 2022-02-08 VITALS — BP 108/70 | HR 75 | Ht 62.0 in | Wt 137.0 lb

## 2022-02-08 DIAGNOSIS — E785 Hyperlipidemia, unspecified: Secondary | ICD-10-CM

## 2022-02-08 MED ORDER — ATORVASTATIN CALCIUM 40 MG PO TABS: 40.0000 mg | ORAL_TABLET | Freq: Every day | ORAL | 3 refills | Status: DC

## 2022-02-08 NOTE — Progress Notes (Signed)
? ? ? ?LIPID CLINIC CONSULT NOTE ? ?Chief Complaint:  ?Manage dyslipidemia ? ?Primary Care Physician: ?Darryl Coss, NP ? ?Primary Cardiologist:  ?None ? ?HPI:  ?Darryl Jackson is a 46 y.o. male who is being seen today for the evaluation of dyslipidemia at the request of Darryl Coss, NP.  This is a pleasant 46 year old [place] male with a history of elevated cholesterol.  In the past he has been on both Lovaza and WelChol but has had elevated cholesterol.  He says he is never taken a statin.  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  Recently had a lipid profile showing total cholesterol 230, HDL 32, triglycerides 157 and LDL 166.  He does not report any early onset heart disease in his family although his father was deceased at an earlier age, presumably not of heart disease.  He also reported hypertension and diabetes in his mother.  His blood pressure appears normal, weight is normal and he does not have any history of diabetes or tobacco use.  He says he exercises pretty regularly.  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 ? ?02/08/2022 ? ?Darryl returns today for follow-up.  He is done well on atorvastatin with marked reduction in his lipids.  His LDL-P has gone down from 2004 and 1 down to 965, LDL-C is now 64 (from 184), triglycerides have gone up to 235 but small LDL-P is down to 747, from 1434.  Overall he feels well and seems to be tolerating the medication without any side effects.  He denies any chest pain or worsening shortness of breath. ? ?PMHx:  ?History reviewed. No pertinent past medical history. ? ?Past Surgical History:  ?Procedure Laterality Date  ? COLONOSCOPY    ? HERNIA REPAIR    ? ? ?FAMHx:  ?Family History  ?Problem Relation Age of Onset  ? Diabetes Mother   ? Healthy Father   ? Healthy Sister   ? ? ?SOCHx:  ? reports that he has never smoked. He has never used smokeless  tobacco. He reports that he does not drink alcohol and does not use drugs. ? ?ALLERGIES:  ?No Known Allergies ? ?ROS: ?Pertinent items noted in HPI and remainder of comprehensive ROS otherwise negative. ? ?HOME MEDS: ?Current Outpatient Medications on File Prior to Visit  ?Medication Sig Dispense Refill  ? acetic acid-hydrocortisone (VOSOL-HC) OTIC solution Place 3 drops into both ears 3 (three) times daily. 10 mL 0  ? B Complex-C (B-COMPLEX WITH VITAMIN C) tablet Take 1 tablet by mouth daily. 90 tablet 3  ? cetirizine (ZYRTEC) 10 MG tablet Take 1 tablet (10 mg total) by mouth daily. 30 tablet 11  ? diclofenac (VOLTAREN) 75 MG EC tablet Take 1 tablet (75 mg total) by mouth 2 (two) times daily. 30 tablet 0  ? gabapentin (NEURONTIN) 100 MG capsule Take 1 capsule (100 mg total) by mouth 3 (three) times daily. 90 capsule 3  ? psyllium (REGULOID) 0.52 g capsule Take 1 capsule (0.52 g total) by mouth daily. 180 capsule 1  ? Vitamin D, Ergocalciferol, (DRISDOL) 1.25 MG (50000 UNIT) CAPS capsule Take 1 capsule (50,000 Units total) by mouth every 7 (seven) days. 12 capsule 0  ? ?No current facility-administered medications on file prior to visit.  ? ? ?LABS/IMAGING: ?No results found for this or any previous visit (from the past 48 hour(s)). ?No results found. ? ?LIPID PANEL: ?   ?  Component Value Date/Time  ? CHOL 230 (H) 03/24/2021 0901  ? CHOL 251 (H) 01/18/2021 0906  ? TRIG 157.0 (H) 03/24/2021 0901  ? HDL 32.00 (L) 03/24/2021 0901  ? HDL 29 (L) 01/18/2021 0906  ? CHOLHDL 7 03/24/2021 0901  ? VLDL 31.4 03/24/2021 0901  ? LDLCALC 166 (H) 03/24/2021 0901  ? [place] 165 (H) 01/18/2021 [id number]  ? ? ?WEIGHTS: ?Wt Readings from Last 3 Encounters:  ?02/08/22 137 lb (62.1 kg)  ?12/26/21 136 lb 12.8 oz (62.1 kg)  ?05/16/21 136 lb 3.2 oz (61.8 kg)  ? ? ?VITALS: ?BP 108/70 (BP Location: Right Arm, Patient Position: Sitting, Cuff Size: Normal)   Pulse 75   Ht 5\' 2"  (1.575 m)   Wt 137 lb (62.1 kg)   BMI 25.06 kg/m?  ? ?EXAM: ?General  appearance: alert, no distress, and thin ?Neck: no carotid bruit, no JVD, and thyroid not enlarged, symmetric, no tenderness/mass/nodules ?Lungs: clear to auscultation bilaterally ?Heart: regular rate and rhythm, S1, S2 normal, no murmur, click, rub or gallop ?Abdomen: soft, non-tender; bowel sounds normal; no masses,  no organomegaly ?Extremities: extremities normal, atraumatic, no cyanosis or edema ?Pulses: 2+ and symmetric ?Skin: Skin color, texture, turgor normal. No rashes or lesions ?Neurologic: Grossly normal ?Psych: Pleasant ? ?EKG: ?Normal sinus rhythm at 75-personally reviewed ? ?ASSESSMENT: ?Mixed dyslipidemia ?Low to intermediate cardiovascular risk ? ?PLAN: ?1.   [name] has had significant improvement in his lipids on atorvastatin.  He is tolerating it well.  There is been a small increase in triglycerides.  We talked about his diet and as to what he can change in order to help with that.  Of also advocated for more physical activity which should be helpful.  He says he is going to be transferred to [place] in probably about 6 months.  We will plan follow-up with him in about 3 to 4 months with a repeat lipid NMR and help him transition to a new provider there. ? ?Pixie Casino, MD, [facility], FACP  ?[place]  ?Medical Director of the Advanced Lipid Disorders &  ?Cardiovascular Risk Reduction Clinic ?Diplomate of the [employer] of Clinical Lipidology ?Attending Cardiologist  ?Direct Dial: [phone]  Fax: [phone]  ?Website:  [email or web address] ? ?Nadean Corwin Tariah Transue ?02/08/2022, 4:11 PM  ? ?

## 2022-02-08 NOTE — Patient Instructions (Signed)
Medication Instructions:  ?No Changes ? ?*If you need a refill on your cardiac medications before your next appointment, please call your pharmacy* ? ? ?Lab Work: ?3-4 months -- fasting lab work  ? ?If you have labs (blood work) drawn today and your tests are completely normal, you will receive your results only by: ?MyChart Message (if you have MyChart) OR ?A paper copy in the mail ?If you have any lab test that is abnormal or we need to change your treatment, we will call you to review the results. ? ? ?Follow-Up: ?At [facility], you and your health needs are our priority.  As part of our continuing mission to provide you with exceptional heart care, we have created designated Provider Care Teams.  These Care Teams include your primary Cardiologist (physician) and Advanced Practice Providers (APPs -  Physician Assistants and Nurse Practitioners) who all work together to provide you with the care you need, when you need it. ? ?We recommend signing up for the patient portal called "MyChart".  Sign up information is provided on this After Visit Summary.  MyChart is used to connect with patients for Virtual Visits (Telemedicine).  Patients are able to view lab/test results, encounter notes, upcoming appointments, etc.  Non-urgent messages can be sent to your provider as well.   ?To learn more about what you can do with MyChart, go to [email or web address].   ? ?Your next appointment:   ?3-4 months ? ?The format for your next appointment:   ?In Person ? ?Provider:   ?Dr. Zoila Shutter ? ?

## 2022-03-28 ENCOUNTER — Other Ambulatory Visit: Payer: Self-pay | Admitting: Registered Nurse

## 2022-03-28 DIAGNOSIS — E559 Vitamin D deficiency, unspecified: Secondary | ICD-10-CM

## 2022-04-08 IMAGING — MR MR HEAD WO/W CM
14 of 16 series · 42 of 48 positions shown · IV contrast (gadavist)
Comparison: None available.

CLINICAL DATA: Initial evaluation for acute blurry vision, possible
cranial nerve palsy.

EXAM:
MRI HEAD AND ORBITS WITHOUT AND WITH CONTRAST
TECHNIQUE: Multiplanar, multiecho pulse sequences of the brain and surrounding
structures were obtained without and with intravenous contrast.
Multiplanar, multiecho pulse sequences of the orbits and surrounding
structures were obtained including fat saturation techniques, before
and after intravenous contrast administration.
CONTRAST:  6mL GADAVIST GADOBUTROL 1 MMOL/ML IV SOLN

[Series 5: DWI · axial · 3.0mm · 0.92mm/px · z∈[-106,+59]mm · 8 of 112 slices shown (1 of 4)]
[im 1/112]
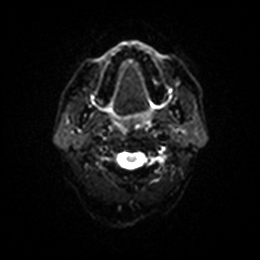
[im 16/112]
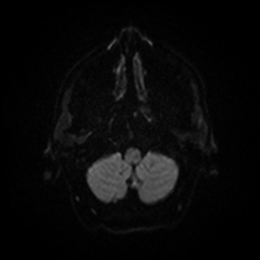
[im 32/112]
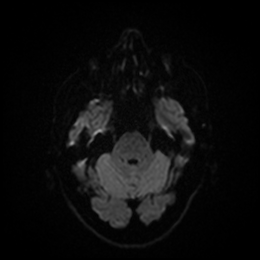
[im 48/112]
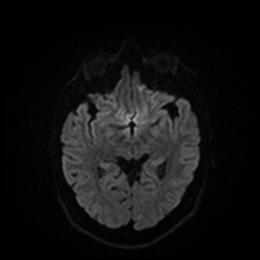
[im 64/112]
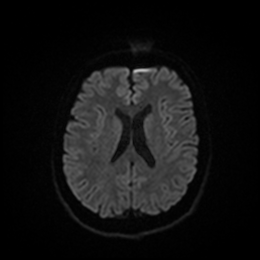
[im 80/112]
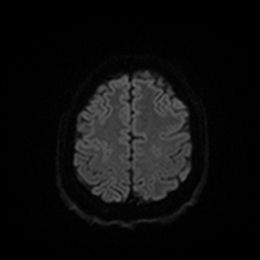
[im 96/112]
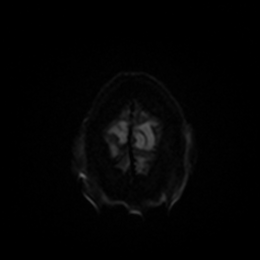
[im 112/112]
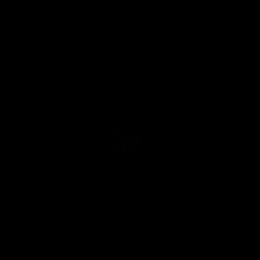

[Series 6: DWI · axial · 3.0mm · 0.92mm/px · z∈[-106,+56]mm · 3 of 53 slices shown (2 of 4)]
[im 1/53]
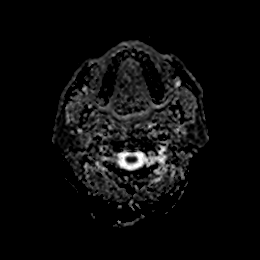
[im 27/53]
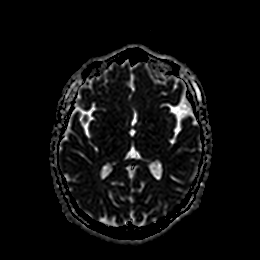
[im 53/53]
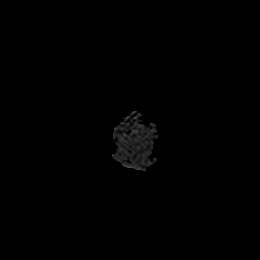

[Series 7: DWI · coronal · 4.0mm · 0.88mm/px · 5 of 78 slices shown (3 of 4)]
[im 1/78]
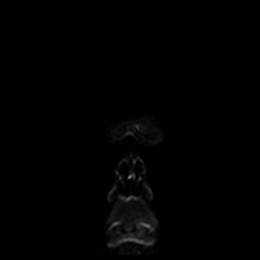
[im 20/78]
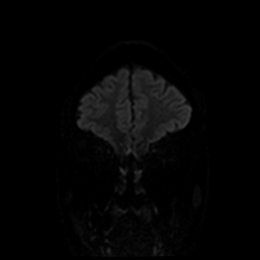
[im 39/78]
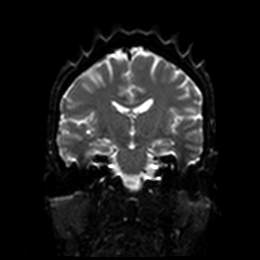
[im 58/78]
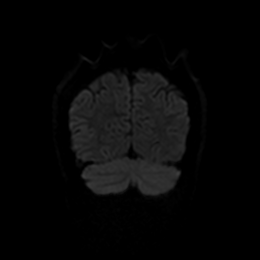
[im 78/78]
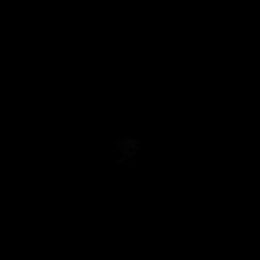

[Series 8: DWI · coronal · 4.0mm · 0.88mm/px · 2 of 39 slices shown (4 of 4)]
[im 1/39]
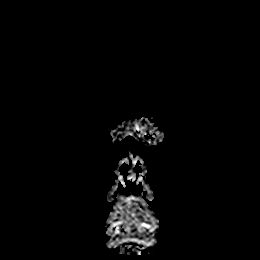
[im 39/39]
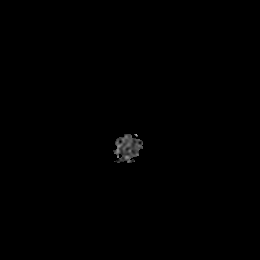

[Series 9: T1 · sagittal · 5.0mm · 0.75mm/px · 2 of 28 slices shown]
[im 1/28]
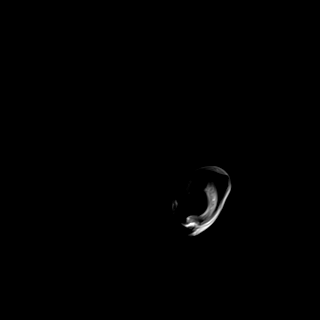
[im 28/28]
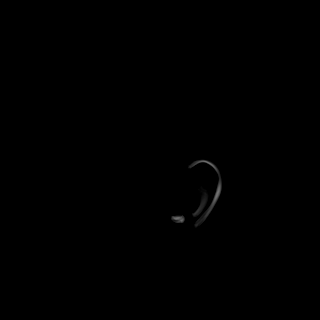

[Series 10: T2 · axial · 5.0mm · 0.75mm/px · z∈[-110,+58]mm · 2 of 29 slices shown]
[im 1/29]
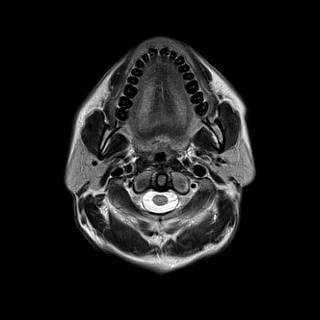
[im 29/29]
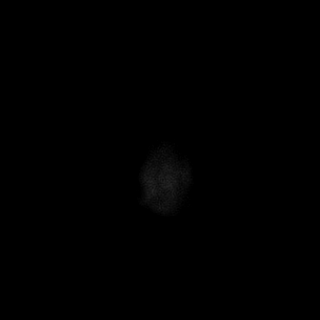

[Series 11: FLAIR · axial · 5.0mm · 0.47mm/px · z∈[-110,+58]mm · 2 of 29 slices shown]
[im 1/29]
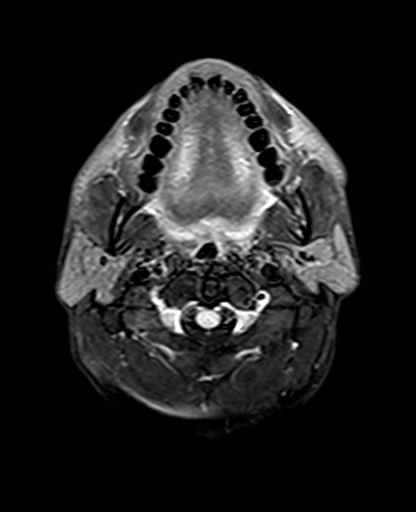
[im 29/29]
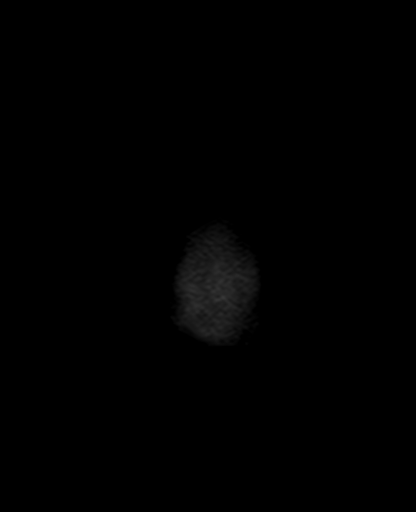

[Series 12: mag_images · axial · 3.0mm · 0.94mm/px · z∈[-105,+59]mm · 3 of 56 slices shown]
[im 1/56]
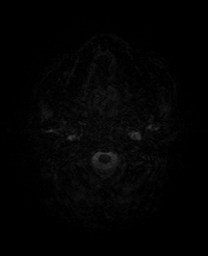
[im 28/56]
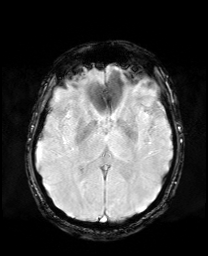
[im 56/56]
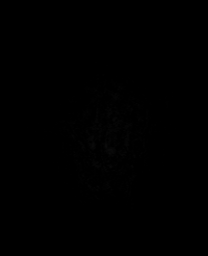

[Series 13: pha_images · axial · 3.0mm · 0.94mm/px · z∈[-105,+56]mm · 3 of 55 slices shown]
[im 1/55]
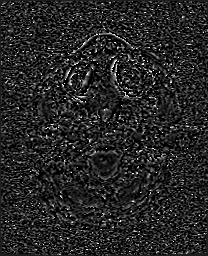
[im 28/55]
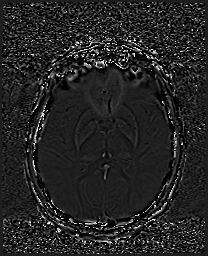
[im 55/55]
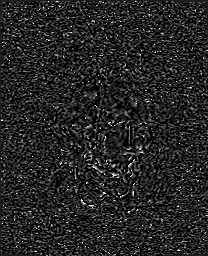

[Series 14: swi_images · axial · 3.0mm · 0.94mm/px · z∈[-105,+59]mm · 3 of 56 slices shown]
[im 1/56]
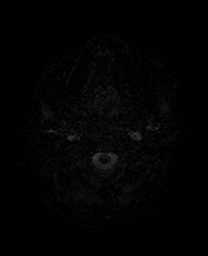
[im 28/56]
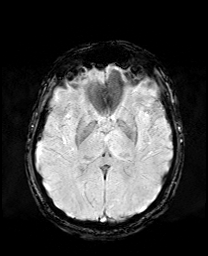
[im 56/56]
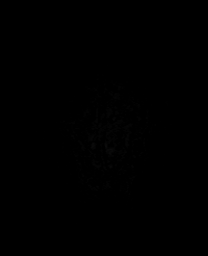

[Series 15: mip_images(sw) · axial · 24.0mm · 0.94mm/px · z∈[-95,+49]mm · 3 of 49 slices shown]
[im 1/49]
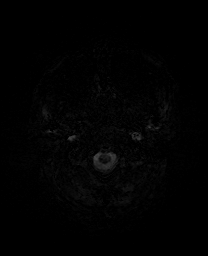
[im 25/49]
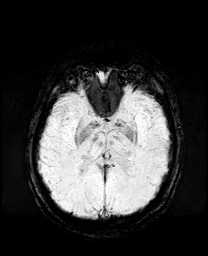
[im 49/49]
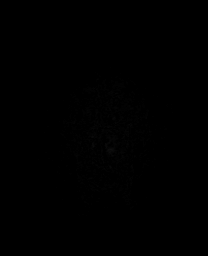

[Series 21: T2 post-contrast · coronal · 5.0mm · 0.72mm/px · 2 of 33 slices shown]
[im 1/33]
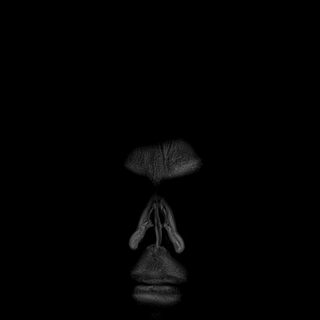
[im 33/33]
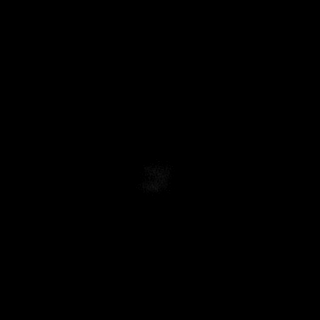

[Series 23: T1 post-contrast · coronal · 5.0mm · 0.34mm/px · 2 of 33 slices shown (1 of 2)]
[im 1/33]
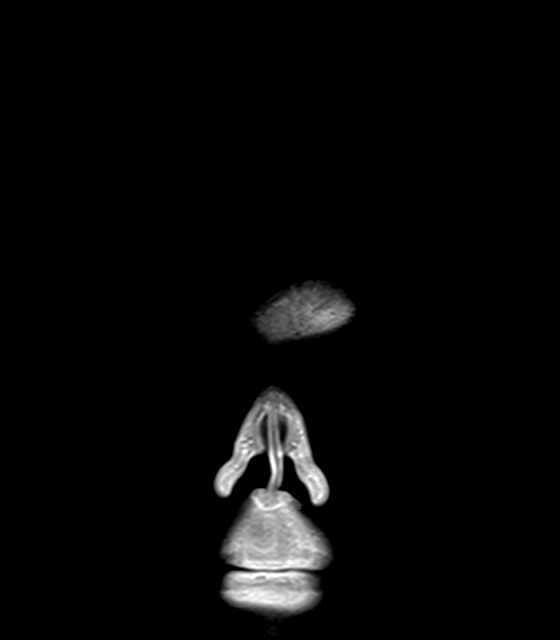
[im 33/33]
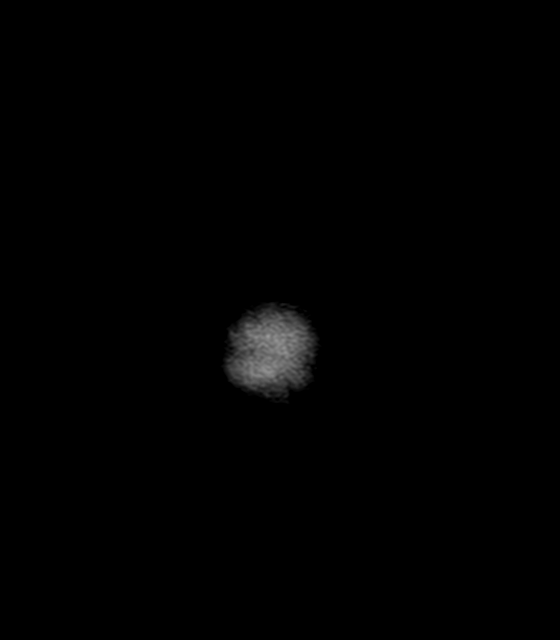

[Series 24: T1 post-contrast · sagittal · 5.0mm · 0.75mm/px · 2 of 28 slices shown (2 of 2)]
[im 1/28]
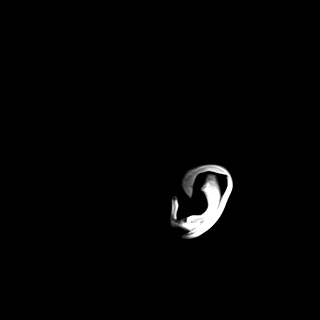
[im 28/28]
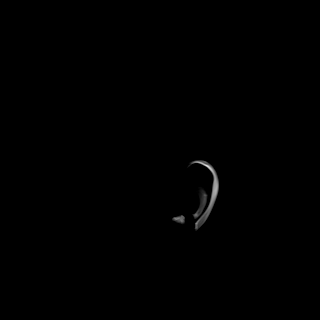

[42 of 48 positions shown; findings below may reference images not displayed]

FINDINGS: MRI HEAD FINDINGS

Brain: Cerebral volume within normal limits. Few scattered
subcentimeter foci of T2/FLAIR hyperintensity noted involving the
supratentorial cerebral white matter, nonspecific, but overall
minimal in nature, and felt to be within normal limits for age.

No evidence for acute or subacute infarct. Gray-white matter
differentiation maintained. No encephalomalacia to suggest chronic
cortical infarction. No evidence for acute or chronic intracranial
hemorrhage.

No mass lesion, midline shift or mass effect. No hydrocephalus or
extra-axial fluid collection. Pituitary gland suprasellar region
normal. Midline structures intact. No abnormal enhancement.

Vascular: Major intracranial vascular flow voids are maintained.

Skull and upper cervical spine: Craniocervical junction within
normal limits. Bone marrow signal intensity normal. No scalp soft
tissue abnormality.

Other: Inner ear structures grossly normal. Mastoid air cells are
largely clear.

MRI ORBITS FINDINGS

Orbits: Globes are symmetric in size with normal appearance and
morphology bilaterally. Optic nerves symmetric and within normal
limits. No intrinsic optic nerve edema or enhancement to suggest
optic neuritis. No abnormality seen along either optic nerve sheath.
Intraconal and extraconal fat well-maintained. Extra-ocular muscles
symmetric and within normal limits. Lacrimal glands normal. No
abnormality about the orbital apices. Optic chiasm normally situated
within the suprasellar cistern. No abnormality seen about the
visualized optic radiations. No visible abnormality about the
cavernous sinus. Superior orbital veins fairly symmetric and within
normal limits.

Visualized sinuses: Mild mucosal thickening within the ethmoidal air
cells and maxillary sinuses. Visualized paranasal sinuses are
otherwise clear.

Soft tissues: Unremarkable.
IMPRESSION: Normal MRI of the brain and orbits. No findings to explain patient's
symptoms identified.

## 2022-06-01 LAB — NMR, LIPOPROFILE
Cholesterol, Total: 128 mg/dL (ref 100–199)
HDL Particle Number: 31.1 umol/L (ref >=30.5)
HDL-C: 35 mg/dL — ABNORMAL LOW (ref >39)
LDL Particle Number: 777 nmol/L (ref <1000)
LDL Size: 19.8 nm — ABNORMAL LOW (ref >20.5)
LDL-C (NIH Calc): 69 mg/dL (ref 0–99)
LP-IR Score: 54 — ABNORMAL HIGH (ref <=45)
Small LDL Particle Number: 554 nmol/L — ABNORMAL HIGH (ref <=527)
Triglycerides: 137 mg/dL (ref 0–149)

## 2022-06-06 ENCOUNTER — Ambulatory Visit (INDEPENDENT_AMBULATORY_CARE_PROVIDER_SITE_OTHER): Payer: No Typology Code available for payment source | Admitting: Internal Medicine

## 2022-06-06 ENCOUNTER — Encounter: Payer: Self-pay | Admitting: Internal Medicine

## 2022-06-06 VITALS — BP 106/72 | HR 68 | Ht 67.0 in | Wt 138.0 lb

## 2022-06-06 DIAGNOSIS — E785 Hyperlipidemia, unspecified: Secondary | ICD-10-CM

## 2022-06-06 NOTE — Progress Notes (Signed)
LIPID CLINIC CONSULT NOTE  Chief Complaint:  Follow-up dyslipidemia  Primary Care Physician: Janeece Agee, NP  Primary Cardiologist:  None  HPI:  Darryl Jackson is a 46 y.o. male who is being seen today for the evaluation of dyslipidemia at the request of Janeece Agee, NP.  This is a pleasant 46 year old [place] Asian male with a history of elevated cholesterol.  In the past he has been on both Lovaza and WelChol but has had elevated cholesterol.  He says he is never taken a statin.  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.  Recently had a lipid profile showing total cholesterol 230, HDL 32, triglycerides 157 and LDL 166.  He does not report any early onset heart disease in his family although his father was deceased at an earlier age, presumably not of heart disease.  He also reported hypertension and diabetes in his mother.  His blood pressure appears normal, weight is normal and he does not have any history of diabetes or tobacco use.  He says he exercises pretty regularly.  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  02/08/2022  Darryl returns today for follow-up.  He is done well on atorvastatin with marked reduction in his lipids.  His LDL-P has gone down from 2004 and 1 down to 965, LDL-C is now 64 (from 184), triglycerides have gone up to 235 but small LDL-P is down to 747, from 1434.  Overall he feels well and seems to be tolerating the medication without any side effects.  He denies any chest pain or worsening shortness of breath.  06/06/2022  Darryl returns today for follow-up.  His cholesterol is further lowered.  He has made further dietary changes.  He remains on high-dose atorvastatin.  LDL particle number is now 777 with LDL-C of 69, HDL-C of 35, triglycerides normal at 137 and small LDL particle #554.  PMHx:  No past medical history on file.  Past Surgical History:   Procedure Laterality Date   COLONOSCOPY     HERNIA REPAIR      FAMHx:  Family History  Problem Relation Age of Onset   Diabetes Mother    Healthy Father    Healthy Sister     SOCHx:   reports that he has never smoked. He has never used smokeless tobacco. He reports that he does not drink alcohol and does not use drugs.  ALLERGIES:  No Known Allergies  ROS: Pertinent items noted in HPI and remainder of comprehensive ROS otherwise negative.  HOME MEDS: Current Outpatient Medications on File Prior to Visit  Medication Sig Dispense Refill   atorvastatin (LIPITOR) 40 MG tablet Take 1 tablet (40 mg total) by mouth daily. 90 tablet 3   B Complex-C (B-COMPLEX WITH VITAMIN C) tablet Take 1 tablet by mouth daily. 90 tablet 3   No current facility-administered medications on file prior to visit.    LABS/IMAGING: No results found for this or any previous visit (from the past 48 hour(s)). No results found.  LIPID PANEL:    Component Value Date/Time   CHOL 230 (H) 03/24/2021 0901   CHOL 251 (H) 01/18/2021 0906   TRIG 157.0 (H) 03/24/2021 0901   HDL 32.00 (L) 03/24/2021 0901   HDL 29 (L) 01/18/2021 0906   CHOLHDL 7 03/24/2021 0901   VLDL 31.4 03/24/2021 0901   LDLCALC 166 (H) 03/24/2021 0901   LDLCALC  165 (H) 01/18/2021 0906    WEIGHTS: Wt Readings from Last 3 Encounters:  06/06/22 138 lb (62.6 kg)  02/08/22 137 lb (62.1 kg)  12/26/21 136 lb 12.8 oz (62.1 kg)    VITALS: BP 106/72   Pulse 68   Ht 5\' 7"  (1.702 m)   Wt 138 lb (62.6 kg)   SpO2 99%   BMI 21.61 kg/m   EXAM: Deferred  EKG: Deferred  ASSESSMENT: Mixed dyslipidemia, goal LDL <70 Low to intermediate cardiovascular risk  PLAN: 1.   Mr. Jackson has had further improvement in his dyslipidemia and now is at goal cholesterol.  LDL-C is less than 70 at 69, LDL particle #777 with a small LDL particle number barely elevated at 554.  Triglycerides have normalized.  He has made excellent progress with diet  and lifestyle changes in addition to his statin.  I would advise he continue on his 40 mg atorvastatin dose daily.  He will be moving to [place].  If he wishes to follow-up with a lipidologist there, I recommended Dr. Bellaire.  Follow-up with me as needed  Mosie Epstein, MD, Chrystie Nose    [facility]  Medical Director of the Advanced Lipid Disorders &  Cardiovascular Risk Reduction Clinic Diplomate of the American Board of Clinical Lipidology Attending Cardiologist  Direct Dial: [phone]  Fax: [phone]  Website:  [email or web address]  [phone] 06/06/2022, 3:37 PM

## 2022-06-06 NOTE — Patient Instructions (Signed)
Medication Instructions:  NO CHANGES  *If you need a refill on your cardiac medications before your next appointment, please call your pharmacy*   Follow-Up: At [facility], you and your health needs are our priority.  As part of our continuing mission to provide you with exceptional heart care, we have created designated Provider Care Teams.  These Care Teams include your primary Cardiologist (physician) and Advanced Practice Providers (APPs -  Physician Assistants and Nurse Practitioners) who all work together to provide you with the care you need, when you need it.  We recommend signing up for the patient portal called "MyChart".  Sign up information is provided on this After Visit Summary.  MyChart is used to connect with patients for Virtual Visits (Telemedicine).  Patients are able to view lab/test results, encounter notes, upcoming appointments, etc.  Non-urgent messages can be sent to your provider as well.   To learn more about what you can do with MyChart, go to [email or web address].    Your next appointment:   AS NEEDED with Dr. Rennis Golden     Dr. Frederica Kuster  [facility] ) is located in [place] at: [facility] [address], [address] [place], [place] [address] [phone]

## 2022-06-25 ENCOUNTER — Telehealth: Payer: Self-pay | Admitting: Registered Nurse

## 2022-06-25 ENCOUNTER — Telehealth: Payer: Self-pay | Admitting: Internal Medicine

## 2022-06-25 MED ORDER — ATORVASTATIN CALCIUM 40 MG PO TABS: 40.0000 mg | ORAL_TABLET | Freq: Every day | ORAL | 3 refills | Status: AC

## 2022-06-25 NOTE — Telephone Encounter (Signed)
Encourage patient to contact the pharmacy for refills or they can request refills through [facility]  (Please schedule appointment if patient has not been seen in over a year)    WHAT PHARMACY WOULD THEY LIKE THIS SENT TO: [id number] - [place], [place] - [address]  MEDICATION NAME & DOSE: Vitamin D, Ergocaliferol 1.25 mg   NOTES/COMMENTS FROM PATIENT: Medication ead D/C 03/28/22.  Pt states that he still take this medication and nee a refill on medication.      Front office please notify patient: It takes 48-72 hours to process rx refill requests Ask patient to call pharmacy to ensure rx is ready before heading there.

## 2022-06-25 NOTE — Telephone Encounter (Signed)
 *  STAT* If patient is at the pharmacy, call can be transferred to refill team.   1. Which medications need to be refilled? (please list name of each medication and dose if known)   atorvastatin (LIPITOR) 40 MG tablet     2. Which pharmacy/location (including street and city if local pharmacy) is medication to be sent to? [id number] - [place], [place] - [address]  3. Do they need a 30 day or 90 day supply? 90 days
